# Patient Record
Sex: Male | Born: 1970 | Race: White
Health system: Western US, Academic
[De-identification: ages and names within clinical notes are randomized; demographics above are authoritative.]

---

## 2006-12-01 ENCOUNTER — Ambulatory Visit (EMERGENCY_DEPARTMENT_HOSPITAL): Payer: Self-pay

## 2007-02-16 ENCOUNTER — Encounter (HOSPITAL_BASED_OUTPATIENT_CLINIC_OR_DEPARTMENT_OTHER): Payer: Medicaid Other

## 2007-02-20 ENCOUNTER — Ambulatory Visit (HOSPITAL_BASED_OUTPATIENT_CLINIC_OR_DEPARTMENT_OTHER): Payer: Medicaid Other

## 2007-02-23 ENCOUNTER — Encounter (HOSPITAL_BASED_OUTPATIENT_CLINIC_OR_DEPARTMENT_OTHER): Payer: Medicaid Other

## 2007-04-13 ENCOUNTER — Encounter (HOSPITAL_BASED_OUTPATIENT_CLINIC_OR_DEPARTMENT_OTHER): Payer: Medicaid Other | Admitting: Internal Medicine

## 2007-08-25 ENCOUNTER — Encounter (HOSPITAL_BASED_OUTPATIENT_CLINIC_OR_DEPARTMENT_OTHER): Payer: Medicaid Other

## 2007-08-25 ENCOUNTER — Encounter (HOSPITAL_BASED_OUTPATIENT_CLINIC_OR_DEPARTMENT_OTHER): Payer: Self-pay

## 2007-08-25 ENCOUNTER — Ambulatory Visit (HOSPITAL_BASED_OUTPATIENT_CLINIC_OR_DEPARTMENT_OTHER): Payer: Medicaid Other

## 2007-08-25 VITALS — BP 117/77 | HR 134

## 2007-08-25 MED ORDER — OMEPRAZOLE 20 MG OR CPDR
DELAYED_RELEASE_CAPSULE | ORAL | Status: DC
Start: 2007-08-25 — End: 2007-12-18

## 2007-08-25 MED ORDER — VENTOLIN HFA 108 (90 BASE) MCG/ACT IN AERS
INHALATION_SPRAY | RESPIRATORY_TRACT | Status: DC
Start: 2007-08-25 — End: 2008-03-09

## 2007-08-25 MED ORDER — SYMBICORT 160-4.5 MCG/ACT IN AERO
INHALATION_SPRAY | RESPIRATORY_TRACT | Status: DC
Start: 2007-08-25 — End: 2008-03-09

## 2007-08-25 MED ORDER — HYDROMORPHONE HCL 2 MG OR TABS
ORAL_TABLET | ORAL | Status: DC
Start: 2007-08-25 — End: 2007-09-25

## 2007-08-25 MED ORDER — NORTRIPTYLINE HCL 50 MG OR CAPS
ORAL_CAPSULE | ORAL | Status: DC
Start: 2007-08-25 — End: 2007-09-27

## 2007-08-25 MED ORDER — CITALOPRAM HYDROBROMIDE 40 MG OR TABS
ORAL_TABLET | ORAL | Status: DC
Start: 2007-08-25 — End: 2008-03-09

## 2007-08-25 MED ORDER — BACLOFEN 20 MG OR TABS
ORAL_TABLET | ORAL | Status: DC
Start: 2007-08-25 — End: 2008-03-09

## 2007-08-25 MED ORDER — LISINOPRIL 20 MG OR TABS
ORAL_TABLET | ORAL | Status: DC
Start: 2007-08-25 — End: 2008-03-09

## 2007-08-25 NOTE — Progress Notes (Signed)
------------------------------------------    Attending: Donnita Falls, MD  I saw and evaluated the patient with this resident.    I discussed the case with the resident, and have reviewed the resident's note.  I agree with the findings and plan as documented in the resident's note.    Discussed options including changing to another SSRI or wellbutrin.  Patient decided to change to taking Celexa at night to minimize daytime side effects and allow possibility of increase dosing.  Recheck in 2 weeks in person or by phone.  Patient satified with this plan.  Also refer back to urology for recurrent urethral symptoms.  ----------------------------------------

## 2007-08-25 NOTE — Progress Notes (Signed)
Patrick Garrett is a 36 year old male here to discuss the following:    V04.81 VACCINE FOR INFLUENZA  V03.82 VACCINE FOR STREP PNEUMONIAE  V06.1 VACCINE FOR DTP/DTAP  Routine immunizations today.       338.29 CHRONIC PAIN NEC  Patrick Garrett is a 36 yo M to clinic to establish care. Pt notes that he had L sided lung surgery four years ago in [place], [place] at [facility]. He reports that he had previously been seen by ARNP at [facility].  He has also been on chronic pain medications for persistent L chest wall pain. He notes that was diagnosed w/ "bullous disease".  He previously has smoked 1-2packs/ day x 20years. He stopped smoking cigarettes. However, he does continue to chew tobacco. He plans to quit this as well.      He reports that he had been seen by Pulmonology previously.  He reports that about six months after his surgery he began to require 2L O2 via NC. He wears O2 most of the time. He notes that he does feel sob most of the time. This has not changed since his surgery, although he reports that for about six months he had been doing well postoperatively.  He has been on symbicort and ventolin which have helped some of his sob. His sob is most notable in am, improves w/ inhalers. He notes that he is unable to lay flat b/c of sob.  He usually sleeps elevated.  Pt notes that he sometimes walks with a cane, however, he does use his motorized wheelchair for longer distances.  He previously worked as Psychologist, occupational in well ventilated areas.     He notes he has been evaluated by pain consult through his last PCP. He notes that he has numbness from his scar site. Extended down to his LLE, which is also attributed to longstanding chronic back pain.  He notes that he has a constant/cutting pain in his L chest wall in area of surgery since the surgery. Worsens w/ deep inspiration. He additionally has chronic low back pain for which he has had cortisone injection which have provided him w/ little  relief. He has tried acupuncture in past.     No f/c/cough/. Reports just used inhalers prior to vist which explains his rapid heart rate.     PMH  -Depression/ Anxiety: Celexa is helping. Pt notes that 1 month ago went up on Celexa. Pt notes that he plans to start therapy for currently.  No si/hi/vh/ah.   -Pt notes that he has hx of R inguinal hernia repair.   -Hypertension.       Medications:  Current outpatient prescriptions : NORTRIPTYLINE HCL 50 MG OR CAPS, 1 tab daily, Disp: , Rfl: ;  BACLOFEN 20 MG OR TABS, 3 times daily, Disp: , Rfl: ;  HYDROMORPHONE HCL 2 MG OR TABS, 3 tabs daily, Disp: , Rfl: ;  LISINOPRIL 20 MG OR TABS, 1 tab daily, Disp: , Rfl: ;  VENTOLIN HFA 108 (90 BASE) MCG/ACT IN AERS, prn, Disp: , Rfl: ;  SYMBICORT 160-4.5 MCG/ACT IN AERO, 2 puffs in am and 2 puffs in pm, Disp: , Rfl:   OMEPRAZOLE 20 MG OR CPDR, 1 tab in am, Disp: , Rfl: ;  CITALOPRAM HYDROBROMIDE 40 MG OR TABS, 1 tab daily, Disp: , Rfl:     All: NKDA    SHx: He and wife are in process of separating. Pt notes that he was a Psychologist, occupational previously.  Currently he spends his day watches TV.  He does some chores around the house.  Pt notes that he is limited by SOB, pain.     REVIEW OF SYSTEMS:  As above.     OBJECTIVE:  BP 117/77  Pulse 134. 2L O2 NC 98%  General: Thin Caucasian M, in wheelchair. O2 in place via NC  CV: Tachycardic, regular rhythm. No m  Pulm: L sided thoractomy scar. Pt notes numbness over scar site on palpation.  No crackles, wheeze. Pt is not tachypneic.   MSKE: Upper ext, DIP B w/ clubbing of finger nails.     ASSESSMENT AND PLAN: 36 yo M w/ chronic pain, lung disease (dx unclear at this point, will await records, conerning for possible copd given hx of "bullous" disease per pt).    V04.81 VACCINE FOR INFLUENZA  V03.82 VACCINE FOR STREP PNEUMONIAE  V06.1 VACCINE FOR DTP/DTAP  -Given respiratory status, advised Pt that he should have flu shot annually, pneumovax today.  -Tdap today as well.     338.29 CHRONIC PAIN  NEC  Plan: STANDARD DRUG SCREEN, URN  -Continue current regimen.   -Utox today to establish Pt is taking medicatinos as listed.   -Established pain contract w/ patient. Discussed w/ Pt in detail terms of contract, additionally advised Pt that goals of pain management will be to increase his function, will not be able to completely take away his pain.    -Consider methadone for long term pain control. Advised Pt that I will need to see his records prior to refills  -Will also need records from surgery as well as pulmonary notes.   -Stable O2 sat today.     RTC as soon as records made available to me.   Audie Box, MD

## 2007-08-25 NOTE — Progress Notes (Signed)
-----------------------------------------------    FOB BASIC SERVICES  Patient checked in:  YES  Vitals:  YES  Medications reviewed:  YES     FU visit and/or single test scheduled:  NO  Room Use in Excess of 25 Minutes:  no  Room Use Additional 25 Minutes : no  Chaperoning (other than procedures): no    Total number of YES responses: 3  Completed by: Haeley Fordham    ------------------------------------------  Pre-immunization screening questions were asked and answered.  VIS given to patient.  Patient has no allergies to vaccine or components.  Patient or their caregiver gave verbal consent for immunization.      I administered the injection.  Mehdi Gironda, Medical Assistant

## 2007-08-26 ENCOUNTER — Telehealth (HOSPITAL_BASED_OUTPATIENT_CLINIC_OR_DEPARTMENT_OTHER): Payer: Self-pay

## 2007-08-26 ENCOUNTER — Encounter (HOSPITAL_BASED_OUTPATIENT_CLINIC_OR_DEPARTMENT_OTHER): Payer: Self-pay

## 2007-08-26 LAB — STANDARD DRUG SCREEN, URN
Alcohol (Ethyl), URN: NEGATIVE mg/dL
Amphet/Methamphetamine Qual,URN: NEGATIVE
Barbiturate (Qual), URN: NEGATIVE
Benzodiazepines (Qual), URN: NEGATIVE
Cannabinoids (Qual), URN: NEGATIVE
Cocaine (Qual), URN: NEGATIVE
Drug Screen Test Info, URN: POSITIVE
Drug Screen Test Info, URN: POSITIVE
Methadone (Qual), URN: POSITIVE
Opiates (Qual), URN: POSITIVE
Phencyclidine (Qual), URN: NEGATIVE
Tricyclic Antidepressants, URN: POSITIVE

## 2007-08-26 NOTE — Telephone Encounter (Signed)
Called Mr. Cordaro back. Created letter, will have faxed to [employer]. Mr. Rainville did not mention at our visit that he is on Methadone in addition to the hydromorphone. He states that he would like to be taken off methadone. However, emphasized methadone is typically used for long acting pain control. WIll not make changes until I see records from prior clinc. Additionally, advised Mr. Salek that I would like him to establish w/ a counselor prior to considering tx w/ Chantix given black box warning. I think that he may need adjustment on medication for depression. It is possible to try zyban, however, may prolong effects of TCA.  Advised Mr. Langenbach that I need to see his records, particularly the pain management records prior to making changes to medications.     Advised to continue on Methadone (he has enough of this medication at home).  Will see him on f/u after records are faxed.     Takelia Urieta,MD

## 2007-08-26 NOTE — Telephone Encounter (Signed)
Pt called asking about medication Chantix you discussed yesterday, will you prescribe this medication? Pls adv pt-

## 2007-08-26 NOTE — Telephone Encounter (Signed)
Patient called, needs a letter from you indicating that he needs transportation on [employer] [place] and used o2. Please call patient if you need more information. Please fax to [employer] [phone] attention: Karen Kitchens

## 2007-09-05 NOTE — Progress Notes (Addendum)
Addended by: Donnal Moat on: 09/05/2007 1:46:26 PM     Modules accepted: Orders

## 2007-09-05 NOTE — Progress Notes (Addendum)
follow up appt made fob level added

## 2007-09-21 ENCOUNTER — Telehealth (HOSPITAL_BASED_OUTPATIENT_CLINIC_OR_DEPARTMENT_OTHER): Payer: Self-pay

## 2007-09-21 NOTE — Telephone Encounter (Signed)
Called pt to review documentation for [employer]. Pt notes that he cannot access public transportation due to o2 requirement.  However, will need to assess if pt needs attendant which is what [employer] requires. I need to verify this w/ patient. Advised to call tomorrow as I am in clinic.  Coye Dawood,MD

## 2007-09-25 ENCOUNTER — Ambulatory Visit (HOSPITAL_BASED_OUTPATIENT_CLINIC_OR_DEPARTMENT_OTHER): Payer: Medicaid Other

## 2007-09-25 ENCOUNTER — Telehealth (HOSPITAL_BASED_OUTPATIENT_CLINIC_OR_DEPARTMENT_OTHER): Payer: Self-pay

## 2007-09-25 VITALS — BP 129/85 | HR 102 | Temp 97.7°F | Wt 209.0 lb

## 2007-09-25 MED ORDER — HYDROMORPHONE HCL 2 MG OR TABS
ORAL_TABLET | ORAL | Status: DC
Start: 2007-09-25 — End: 2007-10-23

## 2007-09-25 MED ORDER — PREDNISONE 20 MG OR TABS
ORAL_TABLET | ORAL | Status: DC
Start: 2007-09-25 — End: 2007-11-13

## 2007-09-25 MED ORDER — LEVOFLOXACIN 500 MG OR TABS
ORAL_TABLET | ORAL | Status: DC
Start: 2007-09-25 — End: 2007-11-13

## 2007-09-25 NOTE — Telephone Encounter (Signed)
Patient saw you today and forgot to talk with you about his medication for anxiety. I can't remember the name, but has been on it in the past. Please call patient.

## 2007-09-25 NOTE — Progress Notes (Signed)
------------------------------------------    Attending: Doristine Section, MD  I saw and evaluated the patient with this resident.  I repeated the essential components of the history and exam.  I discussed the case with the resident, and have reviewed the resident's note.  I agree with the findings and plan as documented in the resident's note.  Key findings based upon my evaluation and discussion:  Acute Bronchitis.  ----------------------------------------

## 2007-09-25 NOTE — Progress Notes (Signed)
Patrick Garrett is a 37 year old male here to discuss the following:    491.20 OBSTR CHR BRONCHITIS W/O AC EXACERB  (primary encounter diagnosis)  Awaiting records. Hx of bullous disease.  Pt notes that he has coughed up some yellow green sputum. Note that may have had a temperature.  No blood in sputum.  Subjective fevers. Sweats.  Fatigue.  Some ha.  Notes this have been going for 2-3 days.  Notes that he feels his sob has worsened.  Using inhalers. Has responded to steroids before. Can walk hallway at baseline, has not been able to go as far pat 2-3 days.     338.4 CHRONIC PAIN SYNDROME  -[employer]. Pt notes that caregiver (previously girlfriend) will be caregiver.  She woud be his attendant.   -Methadone 90mg . [facility]. Goes in two times a week.  Give him weekly allocation of methadone.  Currently on hydromorphone for breakthrough. Would like decrease TCA to 25mg  in evening as too sedating.       REVIEW OF SYSTEMS:  As above.     OBJECTIVE:  BP 129/85  Pulse 102  Wt 209 lb (94.802 kg) 98% on 2 L  General: [name]. NAD. A/Ox3.  Pt speaks in short sentences which is his baseline. Walking w/ cane today.   Pulm: No crackles. End exp wheeze isolated to LLL.     ASSESSMENT AND PLAN:    491.20 OBSTR CHR BRONCHITIS W/O AC EXACERB  -Change in sputum, cough. Concern this may be beginning of exacerbation. Afebrile today. Does not sound clinically like a pneumonia. Subjective sensation that sob has worsened.   -Pred burst. Levoquin for coverage for exacerbation as will manage as outpatient. Pt aware of signs to seek care over the weekend.   -Continue symbicort, ventolin.     338.4 CHRONIC PAIN SYNDROME  -Hydromorphone refilled, [facility].   -Pt has request records be sent to clinic.   -Placed initial referral for pain consult.     RTC in 1 month or prn.     Audie Box, MD

## 2007-09-25 NOTE — Progress Notes (Signed)
-----------------------------------------------    FOB BASIC SERVICES  Patient checked in:  YES  Vitals:  YES  Medications reviewed:  YES     FU visit and/or single test scheduled:  NO  Room Use in Excess of 25 Minutes:  no  Room Use Additional 25 Minutes : no  Chaperoning (other than procedures): no    Total number of YES responses: 3  Completed by: Johnston Maddocks    ------------------------------------------

## 2007-09-26 NOTE — Progress Notes (Signed)
follow up appt made fob level added

## 2007-09-28 NOTE — Telephone Encounter (Signed)
Kriste Basque can you call pt? Can you ask if he needs a refill?      Ali Mclaurin,MD

## 2007-09-28 NOTE — Telephone Encounter (Signed)
F/u w/ Pt.  [phone]  Breathing has improved. Pt notes he has been on benzo in past for anxiety. Notes he just needs something to deal w/ anxiety during the day.  Currently going through transition w/ girl friend. Wife has filed for divorce.   Advised that need records, Pt reports he is in counseling. Would like to schedule appt to meet w/ patient to discuss his anxiety.      Patrick Hinks,MD

## 2007-09-28 NOTE — Telephone Encounter (Signed)
Patient called back, this is not a refill, it's a new medication. He has taken, Clonepine. He's really needing something soon. Please call patient.

## 2007-09-29 ENCOUNTER — Encounter (HOSPITAL_BASED_OUTPATIENT_CLINIC_OR_DEPARTMENT_OTHER): Payer: Medicaid Other

## 2007-10-06 ENCOUNTER — Telehealth (HOSPITAL_BASED_OUTPATIENT_CLINIC_OR_DEPARTMENT_OTHER): Payer: Self-pay

## 2007-10-06 NOTE — Telephone Encounter (Signed)
Spoke w Mr. Noyola regarding his pain medication. He is receiving methadone from tx facility. Per clinic policy, cannot provide w / additional pain medication.  Pt states he has asked about records. However, still have not received.     Fill out wheelchair, bus forms. In pile to be faxed near purple [employer].     Philbert Riser Aries Townley,MD

## 2007-10-06 NOTE — Telephone Encounter (Signed)
Has paperwork for [facility] [place] been rec'd? If rec'd what is status?

## 2007-10-14 ENCOUNTER — Encounter (HOSPITAL_BASED_OUTPATIENT_CLINIC_OR_DEPARTMENT_OTHER): Payer: Self-pay

## 2007-10-14 NOTE — Progress Notes (Addendum)
Placed records in Dr [employer] mailbox for upcoming appt.

## 2007-10-14 NOTE — Progress Notes (Signed)
Records received 10-14-07. Gave them to lisa as requested.

## 2007-10-21 ENCOUNTER — Encounter (HOSPITAL_BASED_OUTPATIENT_CLINIC_OR_DEPARTMENT_OTHER): Payer: Self-pay

## 2007-10-21 ENCOUNTER — Telehealth (HOSPITAL_BASED_OUTPATIENT_CLINIC_OR_DEPARTMENT_OTHER): Payer: Self-pay

## 2007-10-21 NOTE — Telephone Encounter (Signed)
Received records from [facility]. Attempted to contact Pt at numbers provided as well as from past telephone encounters.  Records indicate complex pain hx receiving tx through numerous pain clinics. However no records provided from pain clinic. Unable to ascertain from records why pt is on methadone as noted in utox.  Pt knows that I cannot prescribe pain meds without seeing records or when receiving methadone at another facility. We discussed this during a prior encounter.     Blayke Pinera,MD

## 2007-10-23 ENCOUNTER — Ambulatory Visit (HOSPITAL_BASED_OUTPATIENT_CLINIC_OR_DEPARTMENT_OTHER): Payer: Medicaid Other

## 2007-10-23 VITALS — BP 128/80 | HR 95

## 2007-10-23 MED ORDER — BOOST PLUS OR LIQD
ORAL | Status: DC
Start: 2007-10-23 — End: 2008-01-27

## 2007-10-23 MED ORDER — METHADONE HCL 10 MG OR TABS
ORAL_TABLET | ORAL | Status: DC
Start: 2007-10-23 — End: 2007-11-13

## 2007-10-23 MED ORDER — HYDROMORPHONE HCL 2 MG OR TABS
ORAL_TABLET | ORAL | Status: DC
Start: 2007-10-23 — End: 2007-11-13

## 2007-10-23 NOTE — Progress Notes (Signed)
-----------------------------------------------    FOB BASIC SERVICES  Patient checked in:  YES  Vitals:  YES  Medications reviewed:  YES     FU visit and/or single test scheduled:  NO  Room Use in Excess of 25 Minutes:  no  Room Use Additional 25 Minutes : no  Chaperoning (other than procedures): no    Total number of YES responses: 3  Completed by: Stein Windhorst    ------------------------------------------

## 2007-10-23 NOTE — Progress Notes (Signed)
.-------------------------------------------    Attending: Tillie Rung, MD  I discussed this patient's history and physical findings with the resident, as well as the plan for this patient.  I have reviewed and confirm the findings and plan as documented in the resident's note .  Key findings based upon this discussion:  Pt with COPD s/p thoracotomy and with chronic severe back pain.  Pt has been treating pain with methadone 90 mg/day and dilaudid for "breakthrough" pain which he uses regularly TID.  Was receiving this medication in [place] in weekly supplies.  Pt feels pain is uncontrolled.  Wheelchair bound.  Pain consultation early next week.  Would anticipate tapering off dilaudid.  Would likely need multidisciplinary care.

## 2007-10-26 NOTE — Progress Notes (Signed)
Patrick Garrett is a 37 year old male here to discuss the following:    338.4 CHRONIC PAIN SYNDROME  (primary encounter diagnosis)  37 yo M to clinic for f/u of pain. I contacted Arlys John at [facility]. Noted that they were aware Patrick Garrett received methadone from their clinic and dilaudid from providers at [facility]. Patrick Garrett reports that he had lived in [place] when he moved out here, he found a clinic to provide methadone for his pain (verified no hx of methadone for issues other than pain). He then found a PCP several months later.      I called Arlys John at [facility], informed him that Patrick Garrett will no longer receive methadone from their clinic.   Patrick Garrett notes that he will take his remaining methadone to return to [facility], has two bottles of liquid.  He turned in one bottle to me.  He notes that he continues to have numbness/sharp pains along his L chest wall since this thoracotomy.      REVIEW OF SYSTEMS:  No n/v. Some decreased appetitie.     OBJECTIVE:  BP 128/80  Pulse  95  General: [name]. NAD. A/Ox3  Chest wall: L throactomy scar present, continues to have ttp along chest wall.     ASSESSMENT AND PLAN:    338.4 CHRONIC PAIN SYNDROME  (primary encounter diagnosis)  -Pt has appt w/ chronic pain consult on March 4. Given he is taking both methadone and dilaudid (for breakthrough pain but taking scheduled) I will continue him on these medications for now. I would like him to be tapered off diluadid. Patrick Garrett would like to be on one medication, however, he reports that he does not believe his methadone is working. I informed Patrick Garrett that he is on 90mg  of Methdone, increasing the dose may not be helpful.   -Continue TCA, SSRI.   -Will await recommendations form pain consult.     RTC in 1 month    Audie Box, MD

## 2007-11-04 ENCOUNTER — Encounter (HOSPITAL_BASED_OUTPATIENT_CLINIC_OR_DEPARTMENT_OTHER): Payer: Medicaid Other

## 2007-11-04 NOTE — Progress Notes (Addendum)
Addended by: Donnal Moat on: 11/04/2007 9:12:28 AM     Modules accepted: Orders

## 2007-11-13 ENCOUNTER — Telehealth (HOSPITAL_BASED_OUTPATIENT_CLINIC_OR_DEPARTMENT_OTHER): Payer: Self-pay

## 2007-11-13 ENCOUNTER — Ambulatory Visit (HOSPITAL_BASED_OUTPATIENT_CLINIC_OR_DEPARTMENT_OTHER): Payer: Medicaid Other

## 2007-11-13 VITALS — BP 129/92 | HR 100

## 2007-11-13 MED ORDER — METHADONE HCL 10 MG OR TABS
ORAL_TABLET | ORAL | Status: DC
Start: 2007-11-13 — End: 2007-12-18

## 2007-11-13 MED ORDER — CELEXA 20 MG OR TABS
ORAL_TABLET | ORAL | Status: DC
Start: 2007-11-13 — End: 2008-03-09

## 2007-11-13 MED ORDER — HYDROMORPHONE HCL 2 MG OR TABS
ORAL_TABLET | ORAL | Status: DC
Start: 2007-11-13 — End: 2007-12-18

## 2007-11-13 NOTE — Telephone Encounter (Signed)
Pharmacy faxed patient refill.  Please advise.  Thanks.

## 2007-11-13 NOTE — Progress Notes (Signed)
-------------------------------------------    Attending: Earlie Raveling, MD  I discussed this patient's history and physical findings with the resident, as well as the plan for this patient.  I have reviewed and confirm the findings and plan as documented in the resident's note .  Key findings based upon this discussion:  Depression Rx.  -------------------------------------------

## 2007-11-13 NOTE — Telephone Encounter (Signed)
Patient is calling to let Dr. Chesley Noon know what she needs to write in her letter of reference for public housing.  (This was discussed in today's appointment)  The letter needs to be on [facility] letter head.    And include:    Full name  Address  Telephone  Character re: to housing  What capacity you know him  Does she believe him to be responsible  Does she feel that he'll be a good tenant for public housing    Mail to him ASAP.  He has 5 days to get the letter in.    [address]  [place], [place] [address]

## 2007-11-13 NOTE — Telephone Encounter (Addendum)
Printed letter. Placed in file to be mailed.     Patrick Weygandt,MD

## 2007-11-13 NOTE — Progress Notes (Signed)
-----------------------------------------------    FOB BASIC SERVICES  Patient checked in:  YES  Vitals:  YES  Medications reviewed:  YES     FU visit and/or single test scheduled:  NO  Room Use in Excess of 25 Minutes:  no  Room Use Additional 25 Minutes : no  Chaperoning (other than procedures): no    Total number of YES responses: 3  Completed by: Deveron Furlong    ------------------------------------------  Dr Chesley Noon asked that I get rid of 2 bottles of pt's methadone.  I removed labels, dumped liquid down drain, then put bottles in sharps container with Blanca Friend as witness.

## 2007-11-13 NOTE — Progress Notes (Signed)
Patrick Garrett is a 37 year old male here to discuss the following:    338.4 CHRONIC PAIN SYNDROME  Pt to clinic to f/u on on pain management. Brought in remaining bottles of methadone from prior pain clinic, unopened. Notes that pain has been better now that he is on scheduled doses of his methadone. He notes that he has been able to do more household chores at home. Still limited by respiratory issues.  Continued pain at thoracotomy site, back. No side effects from medication. Taking as prescribed. Reports that he did put a heating pad on his lower back, felt he might have burned this area. As put directly on skin.     311 DEPRESSIVE DISORDER NEC  Had been on Celexa 40mg , reports for six month this dose was helpful. Continues to note that with divorce from wife he has noted worsening depression.  PHQ 9 = 18 today. Most vegetative sx. Difficulty motivating to do things.  Notes that he is still seeing his counselor. No si.        REVIEW OF SYSTEMS:  As above.     OBJECTIVE:  BP 129/92  Pulse 100  General: [name]. NAD. A/Ox3.   Skin: Lower back, some areas of pink skin, healing. No drainage. No ttp.  Superficial.       ASSESSMENT AND PLAN:    338.4 CHRONIC PAIN SYNDROME  -Advised on proper way to use heating pad. Skin healing, no evidence of infection, lower back.   -Continue methadone, hydromorphone. Pt has initial appt w/ pain consult at end of month. Ideally would like to maximize medication so pt is on one agent. Would like to taper off hydromorphone but Pt is on max dose of methadone. Also including TCA, SSRI in regimen.     311 DEPRESSIVE DISORDER NEC  -Depression most likely exacerbated by recent change in relationship  -Increase Celexa to 60mg  (40 +20) i po QD.   -Countinue counseling.   -Advised on side effects.     RTC in one month.     Audie Box, MD

## 2007-11-13 NOTE — Telephone Encounter (Signed)
Have not talked w/ pt about this yet.   Will d/w pt at f/u. Tx for depression, recently increased dose. Do not want to start Chantix until depression  More stable.   \  Myrella Fahs,MD

## 2007-11-14 NOTE — Telephone Encounter (Signed)
done

## 2007-11-17 NOTE — Progress Notes (Addendum)
follow up appointment made fob level added

## 2007-11-17 NOTE — Progress Notes (Addendum)
Addended by: Daleen Snook B on: 11/17/2007 1:50:51 PM     Modules accepted: Orders

## 2007-12-01 ENCOUNTER — Encounter (HOSPITAL_BASED_OUTPATIENT_CLINIC_OR_DEPARTMENT_OTHER): Payer: Medicaid Other

## 2007-12-01 DIAGNOSIS — IMO0001 Reserved for inherently not codable concepts without codable children: Secondary | ICD-10-CM

## 2007-12-01 DIAGNOSIS — M545 Low back pain, unspecified: Secondary | ICD-10-CM

## 2007-12-06 ENCOUNTER — Other Ambulatory Visit (HOSPITAL_BASED_OUTPATIENT_CLINIC_OR_DEPARTMENT_OTHER): Payer: Medicaid Other

## 2007-12-09 ENCOUNTER — Other Ambulatory Visit (HOSPITAL_BASED_OUTPATIENT_CLINIC_OR_DEPARTMENT_OTHER): Payer: Medicaid Other

## 2007-12-10 ENCOUNTER — Encounter (HOSPITAL_BASED_OUTPATIENT_CLINIC_OR_DEPARTMENT_OTHER): Payer: Medicaid Other

## 2007-12-11 ENCOUNTER — Telehealth (HOSPITAL_BASED_OUTPATIENT_CLINIC_OR_DEPARTMENT_OTHER): Payer: Self-pay

## 2007-12-11 NOTE — Telephone Encounter (Signed)
Dr Luan Pulling signed last visits note of 11/13/07, then faxed to number below.

## 2007-12-11 NOTE — Telephone Encounter (Signed)
Pt called with representatives from [facility] in [place] pt is checking in there and the facility must have a medication list with instructions faxed over to [phone] before they will sign in pt- the list MUST be signed by a physician (any)-pls send list

## 2007-12-11 NOTE — Telephone Encounter (Addendum)
Fax came back, so I called the pt (the only tele number on the encounter).  Pt said to disregard message, because his case worker found temporary housing for him today.

## 2007-12-14 ENCOUNTER — Encounter (HOSPITAL_BASED_OUTPATIENT_CLINIC_OR_DEPARTMENT_OTHER): Payer: Medicaid Other

## 2007-12-16 ENCOUNTER — Ambulatory Visit (HOSPITAL_BASED_OUTPATIENT_CLINIC_OR_DEPARTMENT_OTHER): Payer: Medicaid Other | Admitting: Pain Medicine

## 2007-12-18 ENCOUNTER — Ambulatory Visit (HOSPITAL_BASED_OUTPATIENT_CLINIC_OR_DEPARTMENT_OTHER): Payer: Medicaid Other

## 2007-12-18 ENCOUNTER — Encounter (HOSPITAL_BASED_OUTPATIENT_CLINIC_OR_DEPARTMENT_OTHER): Payer: Medicaid Other

## 2007-12-18 VITALS — BP 114/81 | HR 85

## 2007-12-18 MED ORDER — HYDROMORPHONE HCL 2 MG OR TABS
ORAL_TABLET | ORAL | Status: DC
Start: 2007-12-18 — End: 2008-01-12

## 2007-12-18 MED ORDER — PANTOPRAZOLE SODIUM 40 MG OR TBEC
DELAYED_RELEASE_TABLET | ORAL | Status: DC
Start: 2007-12-18 — End: 2008-02-11

## 2007-12-18 MED ORDER — METHADONE HCL 10 MG OR TABS
ORAL_TABLET | ORAL | Status: DC
Start: 2007-12-18 — End: 2008-01-12

## 2007-12-18 MED ORDER — METHADONE HCL 10 MG OR TABS
ORAL_TABLET | ORAL | Status: DC
Start: 2007-12-18 — End: 2007-12-18

## 2007-12-18 MED ORDER — HYDROMORPHONE HCL 2 MG OR TABS
ORAL_TABLET | ORAL | Status: DC
Start: 2007-12-18 — End: 2007-12-18

## 2007-12-18 NOTE — Progress Notes (Signed)
-------------------------------------------    Attending: Doristine Section, MD  I discussed this patient's history and physical findings with the resident, as well as the plan for this patient.  I have reviewed and confirm the findings and plan as documented in the resident's note .  Key findings based upon this discussion:  Chronic Pain refill meds follow up after pain consult.  -------------------------------------------

## 2007-12-18 NOTE — Progress Notes (Signed)
-----------------------------------------------    FOB BASIC SERVICES  Patient checked in:  YES  Vitals:  YES  Medications reviewed:  YES     FU visit and/or single test scheduled:  NO  Room Use in Excess of 25 Minutes:  no  Room Use Additional 25 Minutes : no  Chaperoning (other than procedures): no    Total number of YES responses: 3  Completed by: Analyse Angst    ------------------------------------------

## 2007-12-18 NOTE — Progress Notes (Signed)
Patrick Garrett is a 37 year old male here to discuss the following:    338.4 CHRONIC PAIN SYNDROME  Appreciate recommendations of pain consultants. Recommended addition of topamax. Pt continues to feel as though methadone is causing increased sweating. Pt reports previously had been on oxycontin. Currently on SSRI, methadone, dilaudid. Has stopped nortriptylline. Continues on baclofen.  Pt has received Bilateral ileocostalis trigger point injections, which provided slight temporary relief. Has f/u appt  For caudal block procedure. Additionally consider discogram if epidural block does not improve symptoms    787.1 HEARTBURN  Pt notes that has occasional episodes of harsh taste in mouth, heartburn sx especially in am despite being on omeprazole. Notes on baclofen. No additional etoh at this time. No emesis. No nausea. No abdominal pain. Pt notes several weeks ago small amt of coffee ground appearance in one episode of emesis in setting of heartburn.     REVIEW OF SYSTEMS:  As above.     OBJECTIVE:  BP 114/81  Pulse 85  General: [name]. NAD. Shaved head for sweating.       ASSESSMENT AND PLAN:    338.4 CHRONIC PAIN SYNDROME  -Continue Methadone, Dilaudid  -Will await results of caudal epidural block. Agree sx seem more due to neuropathic pain. Encouraged topamax which patient declined. He is interested in being on fewer medications. However would rather stop methadone. Advised that at this time he needs to be on a long acting pain medication  -If sx not improved will d/w Pt discogram recommendations  -WIll continue to focus patient on purposed of treatment to encourage increased function.   -If sx responding to caudal injections, consider tapering dilaudid.     787.1 HEARTBURN  -Breakthrough reflux on omeprazole, Pt notes that he has not increased the dose but has had reflux on omeprazole.  -Baclofen would be a risk for PUD  -Will check stool h.pylori advised to hold baclofen if can tolerate, hold PPI for  several days than send in stool sample.  Will then plan to start protonix for tx of GERD sx.   -If sx persisting send for EGD.     RTC in one month    Audie Box, MD

## 2007-12-21 ENCOUNTER — Encounter (HOSPITAL_BASED_OUTPATIENT_CLINIC_OR_DEPARTMENT_OTHER): Payer: Medicaid Other | Admitting: Rehabilitative and Restorative Service Providers"

## 2007-12-21 NOTE — Progress Notes (Addendum)
follow up appointment made fob level added

## 2007-12-21 NOTE — Progress Notes (Addendum)
Addended by: LUCHEMBE, KASONDE B on: 12/21/2007      Modules accepted: Orders

## 2007-12-30 ENCOUNTER — Encounter (HOSPITAL_BASED_OUTPATIENT_CLINIC_OR_DEPARTMENT_OTHER): Payer: Medicaid Other

## 2008-01-12 ENCOUNTER — Telehealth (HOSPITAL_BASED_OUTPATIENT_CLINIC_OR_DEPARTMENT_OTHER): Payer: Self-pay

## 2008-01-12 ENCOUNTER — Encounter (HOSPITAL_BASED_OUTPATIENT_CLINIC_OR_DEPARTMENT_OTHER): Payer: MEDICAID

## 2008-01-12 MED ORDER — HYDROMORPHONE HCL 2 MG OR TABS
ORAL_TABLET | ORAL | Status: DC
Start: 2008-01-12 — End: 2008-02-11

## 2008-01-12 MED ORDER — METHADONE HCL 10 MG OR TABS
ORAL_TABLET | ORAL | Status: DC
Start: 2008-01-12 — End: 2008-02-11

## 2008-01-12 NOTE — Telephone Encounter (Addendum)
Pt unable to be seen in clinic due to insurance issues Plans to have this resolved by the first of the month. Has f/u appt w/ pain. Interested in weaning of dilaudid today. Will fill scripts. Pt to f/u in one month for further med management. Pt counseled on side effects.  Will go down by one mg per every few weeks.     Patrick Colligan,MD

## 2008-01-19 ENCOUNTER — Encounter (HOSPITAL_BASED_OUTPATIENT_CLINIC_OR_DEPARTMENT_OTHER): Payer: Medicaid Other

## 2008-01-27 ENCOUNTER — Telehealth (HOSPITAL_BASED_OUTPATIENT_CLINIC_OR_DEPARTMENT_OTHER): Payer: Self-pay

## 2008-01-27 MED ORDER — BOOST PLUS OR LIQD
ORAL | Status: DC
Start: 2008-01-27 — End: 2008-03-09

## 2008-01-27 NOTE — Telephone Encounter (Signed)
[facility] left message on my phone asking for an urgent PA and refill for ensure, because pt out at the end of the month.  Please advise.  Thanks.    Luther Redo   [phone] fax  [phone] ext [phone] tele

## 2008-01-28 NOTE — Telephone Encounter (Addendum)
After talking with [facility], they clarified that pt will only be on [facility] until the end of the month.  After that, he will be on a different insurance.  To get the prior auth, chart notes stating medical necessity, need to be sent to the fax number below.  PA will only be good until the end of the month.  Please advise.  Thanks.

## 2008-01-28 NOTE — Telephone Encounter (Signed)
What is the question I have to answer?    Patrick Lagrange,MD

## 2008-01-29 NOTE — Telephone Encounter (Signed)
I need the chart notes stating medical necessity for Insure to fax to [facility] for the PA.  Please advise.  Thanks.

## 2008-02-01 NOTE — Telephone Encounter (Signed)
Beginning of month. Will address w/ pt at visit as insurance has changed.     Derward Marple,MD

## 2008-02-04 ENCOUNTER — Encounter (HOSPITAL_BASED_OUTPATIENT_CLINIC_OR_DEPARTMENT_OTHER): Payer: Medicaid Other | Admitting: Surgical

## 2008-02-05 ENCOUNTER — Encounter (HOSPITAL_BASED_OUTPATIENT_CLINIC_OR_DEPARTMENT_OTHER): Payer: Medicaid Other

## 2008-02-05 ENCOUNTER — Ambulatory Visit (HOSPITAL_BASED_OUTPATIENT_CLINIC_OR_DEPARTMENT_OTHER): Payer: Self-pay

## 2008-02-08 ENCOUNTER — Telehealth (HOSPITAL_BASED_OUTPATIENT_CLINIC_OR_DEPARTMENT_OTHER): Payer: Self-pay

## 2008-02-08 ENCOUNTER — Ambulatory Visit (HOSPITAL_BASED_OUTPATIENT_CLINIC_OR_DEPARTMENT_OTHER): Payer: Medicaid Other

## 2008-02-08 NOTE — Telephone Encounter (Signed)
Pt scheduled for Thursday-closed encounter

## 2008-02-08 NOTE — Telephone Encounter (Signed)
Pt called to say he will be out of meds on 02/10/08 and [facility] didn't pick him up on Friday-pt can't get an appt w/you until 02/22/08-can this pt get refills by another md-pls adv

## 2008-02-08 NOTE — Telephone Encounter (Signed)
Can you double book him into my schedule on Thursday?

## 2008-02-09 ENCOUNTER — Encounter (HOSPITAL_BASED_OUTPATIENT_CLINIC_OR_DEPARTMENT_OTHER): Payer: Medicaid Other | Admitting: Family Medicine

## 2008-02-09 ENCOUNTER — Telehealth (HOSPITAL_BASED_OUTPATIENT_CLINIC_OR_DEPARTMENT_OTHER): Payer: Self-pay

## 2008-02-09 NOTE — Telephone Encounter (Signed)
Pt at front desk stating he is out of pain meds, would like Dilaudid and Methadone today. Per Dr. Dola Factor no early refills will be given and Pt needs to keep his appt scheduled with Dr. Chesley Noon on 02/11/08 for refills.

## 2008-02-11 ENCOUNTER — Encounter (HOSPITAL_BASED_OUTPATIENT_CLINIC_OR_DEPARTMENT_OTHER): Payer: Self-pay

## 2008-02-11 ENCOUNTER — Ambulatory Visit (HOSPITAL_BASED_OUTPATIENT_CLINIC_OR_DEPARTMENT_OTHER): Payer: MEDICAID

## 2008-02-11 VITALS — BP 136/97 | HR 74

## 2008-02-11 MED ORDER — METHADONE HCL 10 MG OR TABS
ORAL_TABLET | ORAL | Status: DC
Start: 2008-02-11 — End: 2008-03-09

## 2008-02-11 MED ORDER — HYDROMORPHONE HCL 2 MG OR TABS
ORAL_TABLET | ORAL | Status: DC
Start: 2008-02-11 — End: 2008-03-09

## 2008-02-11 MED ORDER — PANTOPRAZOLE SODIUM 40 MG OR TBEC
DELAYED_RELEASE_TABLET | ORAL | Status: DC
Start: 2008-02-11 — End: 2008-03-09

## 2008-02-11 NOTE — Progress Notes (Signed)
-----------------------------------------------    FOB BASIC SERVICES  Patient checked in:  YES  Vitals:  YES  Medications reviewed:  YES     FU visit and/or single test scheduled:  NO  Room Use in Excess of 25 Minutes:  no  Room Use Additional 25 Minutes : no  Chaperoning (other than procedures): no    Total number of YES responses: 3  Completed by: Chau Savell    ------------------------------------------

## 2008-02-11 NOTE — Progress Notes (Signed)
KATLIN CISZEWSKI is a 37 year old male here to discuss the following:    530.81 ESOPHAGEAL REFLUX  (primary encounter diagnosis)  Needs pantoprazole refill. Omeprazole no longer working. Was unable to fill last pantoprazole script.       338.4 CHRONIC PAIN SYNDROME  Unable to make appt for last injection. Tolerated decrease in dilaudid dose. No chills, changes in bm, n/v. Continues to have chest wall and low back pain. Still working on increase work capacity at home in terms of chores.     REVIEW OF SYSTEMS:  As above.     OBJECTIVE:  BP 136/97  Pulse 74  General: {[name]. NAD. A/Ox3  chest wall: Scar from thoractomy, some ttp along scar.     ASSESSMENT AND PLAN:    530.81 ESOPHAGEAL REFLUX  (primary encounter diagnosis)  Refilled pantoprazole.     338.4 CHRONIC PAIN SYNDROME  Continue diluadid taper. Will be off by this month.   Continue current dose of methadone.   Continue to discuss adjunct therapy  Will then plan on decrease methadone dose as is currently fairly high, also could consider switching to alternative long acting agent.     RTC one month    Audie Box, MD

## 2008-02-11 NOTE — Progress Notes (Signed)
-------------------------------------------    Attending: Alvy Beal, MD  I discussed this patient's history and physical findings with the resident, as well as the plan for this patient.  I have reviewed and confirm the findings and plan as documented in the resident's note .  Key findings based upon this discussion: agree with pain med management.    -------------------------------------------

## 2008-02-13 NOTE — Progress Notes (Addendum)
follow up appt made fob level added

## 2008-02-13 NOTE — Progress Notes (Addendum)
Addended by: FONTANOS, LISA on: 02/13/2008      Modules accepted: Orders

## 2008-02-22 ENCOUNTER — Encounter (HOSPITAL_BASED_OUTPATIENT_CLINIC_OR_DEPARTMENT_OTHER): Payer: Medicaid Other

## 2008-02-22 ENCOUNTER — Encounter (HOSPITAL_BASED_OUTPATIENT_CLINIC_OR_DEPARTMENT_OTHER): Payer: Self-pay

## 2008-03-01 ENCOUNTER — Encounter (HOSPITAL_BASED_OUTPATIENT_CLINIC_OR_DEPARTMENT_OTHER): Payer: Self-pay

## 2008-03-03 ENCOUNTER — Encounter (HOSPITAL_BASED_OUTPATIENT_CLINIC_OR_DEPARTMENT_OTHER): Payer: MEDICAID

## 2008-03-09 ENCOUNTER — Telehealth (HOSPITAL_BASED_OUTPATIENT_CLINIC_OR_DEPARTMENT_OTHER): Payer: Self-pay

## 2008-03-09 ENCOUNTER — Ambulatory Visit (HOSPITAL_BASED_OUTPATIENT_CLINIC_OR_DEPARTMENT_OTHER): Payer: Medicaid Other

## 2008-03-09 VITALS — BP 139/92 | HR 90

## 2008-03-09 MED ORDER — SYMBICORT 160-4.5 MCG/ACT IN AERO
INHALATION_SPRAY | RESPIRATORY_TRACT | Status: DC
Start: 2008-03-09 — End: 2008-08-11

## 2008-03-09 MED ORDER — PANTOPRAZOLE SODIUM 40 MG OR TBEC
DELAYED_RELEASE_TABLET | ORAL | Status: DC
Start: 2008-03-09 — End: 2008-03-15

## 2008-03-09 MED ORDER — TOPAMAX 25 MG OR TABS
ORAL_TABLET | ORAL | Status: DC
Start: 2008-03-09 — End: 2008-04-05

## 2008-03-09 MED ORDER — CITALOPRAM HYDROBROMIDE 40 MG OR TABS
ORAL_TABLET | ORAL | Status: DC
Start: 2008-03-09 — End: 2008-08-11

## 2008-03-09 MED ORDER — CELEXA 20 MG OR TABS
ORAL_TABLET | ORAL | Status: DC
Start: 2008-03-09 — End: 2008-08-11

## 2008-03-09 MED ORDER — LISINOPRIL 20 MG OR TABS
ORAL_TABLET | ORAL | Status: DC
Start: 2008-03-09 — End: 2008-08-11

## 2008-03-09 MED ORDER — METHADONE HCL 10 MG OR TABS
ORAL_TABLET | ORAL | Status: DC
Start: 2008-03-09 — End: 2008-05-31

## 2008-03-09 MED ORDER — BACLOFEN 20 MG OR TABS
ORAL_TABLET | ORAL | Status: DC
Start: 2008-03-09 — End: 2008-08-11

## 2008-03-09 MED ORDER — VENTOLIN HFA 108 (90 BASE) MCG/ACT IN AERS
INHALATION_SPRAY | RESPIRATORY_TRACT | Status: DC
Start: 2008-03-09 — End: 2008-08-11

## 2008-03-09 NOTE — Telephone Encounter (Signed)
needs refills. switching pharmacies--[facility] [address] # Patrick Garrett - [phone]

## 2008-03-09 NOTE — Progress Notes (Addendum)
Patrick Garrett is a 37 year old male who presents for ongoing management of chronic pain.       Interval history/ chief complaint(s): Completed dilaudid taper. Has been off dilauded. Notes that methadone is not helping pain. Notes constant pain 8-9/10.  Pt notes side chest wall pain as well as pain in lower back and legs. Tried Nortryptilline came off as did not help. Gabapentin did not help. Watches TV, in wheelchair. Able to walk a little with cane.   Worse pain w/ standing in one place.  Feels as though methadone is causing worsening sweating, feeling hot.  No n/v/constipation.   Pain control:  Above.   Functional goals and status: Goal to be more active.  Pt notes last month has cancelled appointments, feels miserable.  Notes aching pain in lower extremities.  Numbness in legs.       Diagnosis for chronic pain: lumbar interspinous ligament laxity, secondary myofascial back pain and sacraliliac joint instability and now it seems like there is a lumbar disc deformity/protrusion in L5/S1 region.   Current treatment plan : Methadone,  Possibly add topamax  Refill requests: Methadone as well as other medications.     2. GERD: Notes that continuing to wake up in AM w/ reflux sx despite 40mg  protonix.  Did not leave stool sample for h.pylori as we had discussed last visit. No change in BM. No dark stools. No hematochezia. No abdominal pain.     Patient Active Problem List   Diagnoses Code   . CHRONIC PAIN SYNDROME 338.4   . DEPRESSIVE DISORDER NEC 311   . HYPERTENSION NOS 401.9   . CHRONIC AIRWAY OBSTRUCTION NEC 496   . ESOPHAGEAL REFLUX 530.81            Current outpatient prescriptions: PANTOPRAZOLE SODIUM 40 MG OR TBEC, Take 1 tablet by mouth every morning, Disp: 30, Rfl: 6;  METHADONE HCL 10 MG OR TABS, Take three tablets three times a day., Disp: 270, Rfl: 0;  CELEXA 20 MG OR TABS, Please take one tablet once a day.   (total dose with the 40mg  tablet is 60mg  / day)., Disp: 30, Rfl: 1;  BACLOFEN 20 MG OR TABS, Please take  one tablet three times a day., Disp: 90, Rfl: 6  LISINOPRIL 20 MG OR TABS, 1 tab daily, Disp: 30, Rfl: 6;  VENTOLIN HFA 108 (90 BASE) MCG/ACT IN AERS, One to two puffs Q four to six hours prn sob/ wheezing., Disp: 1 inhaler, Rfl: 6;  SYMBICORT 160-4.5 MCG/ACT IN AERO, 2 puffs in am and 2 puffs in pm, Disp: 1 inhaler, Rfl: 6;  CITALOPRAM HYDROBROMIDE 40 MG OR TABS, 1 tab daily, Disp: 30, Rfl: 6    Pertinent ROS:  As above.     OBJECTIVE:  BP 139/92  Pulse 125--->90  GEN: [name]. Diaphoretic.   MSKE:  Chest wall ttp palpation over thoracotomy scar.  LE:  Spasms at hip w// leg flexion/ extension. Toe extension intact B.  4/5 strength w/ knee extension/flexion, hip flexion/ extension    ASSESSMENT/PLAN:   338.4 CHRONIC PAIN SYNDROME  -Pt is now off dilaudid.   -At this point could consider transitioning from methadone to another longer acting agent like MS Contin, however, given the amt of methadone pt is on the equivalency dose of other medications is quite high. I explained to Patrick Garrett that if methadone is not helping is pain, another opiate may not work, however I am willing to try the other long acting agent  we can offer which is MS Contin.  He most likely has developed quite a bit of tolerance/ hypersenstivity to methadone.  If he continues to have pain on MS Contin then he will need to understand this is will be considered an opiate tx failure and there will no longer be an indication to continue him on opiates.   -Will start topamax per pain consult recs  25mg  i po QD, w/ plan to increase to 50mg  i po QD if tolerating within a week.   -Continuing to encourage physical acitvity in patient.        2. GERD:  -Increase PPI to BID  -If not improving will need to stop baclofen as this may be exacerbating symptoms.   -If still not improving sx will pursue EGD.       The treatment goals and appropriate use of medications was reviewed with the patient today, with an emphasis on achieving functional  goals.    Follow-up visit in 1 month, or sooner prn.    Audie Box, MD

## 2008-03-09 NOTE — Progress Notes (Signed)
-------------------------------------------    Attending: Alvy Beal, MD  I discussed this patient's history and physical findings with the resident, as well as the plan for this patient.  I have reviewed and confirm the findings and plan as documented in the resident's note .  Key findings based upon this discussion: Agree with plan to cont. Methadone and add Topamax, for chronic pain.   -------------------------------------------

## 2008-03-09 NOTE — Progress Notes (Signed)
FOB Worksheet for MA Services  Arrived:  YES  Vital(s) &/or pain assessment:  YES  Lab, chart, reports &/or meds reviewed:  YES  Medication(s)  Symptoms, diagnosis, plan, meds &/or other explained/clarified:  NO  Room use 26-50 min (excludes wait time):  NO  Room use >50 min (excludes wait time):  NO  Chaperone: NO  Other procedures (not separately billed):  NO    MA-only visit  NO    Total number of YES responses: 3  Completed by: Aaylah Pokorny

## 2008-03-09 NOTE — Telephone Encounter (Signed)
Meds refilled to new pharmacy during todays visit.

## 2008-03-10 ENCOUNTER — Telehealth (HOSPITAL_BASED_OUTPATIENT_CLINIC_OR_DEPARTMENT_OTHER): Payer: Self-pay

## 2008-03-10 NOTE — Telephone Encounter (Signed)
i spoke w/ pharmacy regarding transition from methadone to MS Contin.   Will drop form 90mg  QD to 45QD immediately than every 3 days will drop by 10mg . Once down to 25mg  will start 30mg  BID MS Contin.  Once both meds overlapped x 3 days will stop the Methadone.  He can have antiemetics for withdrawal.  Instructed Mr. Boomhower that the max for MS contin will be 90mg  total dose.  If this does not work opiates will no longer be indicated for him.     I would like the front desk to contact Mr. Devine to schedule an appt w/ me ASAP.      Jaana Brodt,MD

## 2008-03-15 ENCOUNTER — Ambulatory Visit (HOSPITAL_BASED_OUTPATIENT_CLINIC_OR_DEPARTMENT_OTHER): Payer: Medicaid Other

## 2008-03-15 VITALS — BP 126/88 | HR 110

## 2008-03-15 MED ORDER — PANTOPRAZOLE SODIUM 40 MG OR TBEC
DELAYED_RELEASE_TABLET | ORAL | Status: DC
Start: 2008-03-15 — End: 2008-08-11

## 2008-03-15 MED ORDER — MS CONTIN 30 MG OR TBCR
EXTENDED_RELEASE_TABLET | ORAL | Status: DC
Start: 2008-03-15 — End: 2008-04-06

## 2008-03-15 MED ORDER — PHENERGAN 25 MG OR TABS
ORAL_TABLET | ORAL | Status: DC
Start: 2008-03-15 — End: 2008-04-15

## 2008-03-15 NOTE — Patient Instructions (Signed)
-  Drop Methadone down from 90mg  a day to 45 mg a day (15mg  TID). Take that for three days. Than decrease to 35mg  a day (10mg , 10mg , 15mg ). Take that for three day, than decrease to 25mg  a day (10, 15mg ). At this point start taking MS Contin 30mg  twice a day. Take both medications for three days. Then stop the methadone. Bring in the rest of the methadone to your visit.     -I will provide you with phenergan to help with nausea. Please let me know if your withdrawal sx are worsening.     -Then I will see you a back at the regular visit.     Kavitha Chunchu,MD

## 2008-03-15 NOTE — Progress Notes (Signed)
FOB Worksheet for MA Services  Arrived:  YES  Vital(s) &/or pain assessment:  YES  Lab, chart, reports &/or meds reviewed:  YES  Medication(s)  Symptoms, diagnosis, plan, meds &/or other explained/clarified:  NO  Room use 26-50 min (excludes wait time):  NO  Room use >50 min (excludes wait time):  NO  Chaperone: NO  Other procedures (not separately billed):  NO    MA-only visit  NO    Total number of YES responses: 3  Completed by: Erianna Jolly

## 2008-03-15 NOTE — Progress Notes (Signed)
-------------------------------------------    Attending: Illa Level, MD  I discussed this patient's history and physical findings with the resident, as well as the plan for this patient.  I have reviewed and confirm the findings and plan as documented in the resident's note .  Key findings based upon this discussion:  Chronic LBP nad thoracotomy site   Med management.consider transition from methadone to MS Contin BID discussed realistic limitations  -------------------------------------------

## 2008-03-16 NOTE — Progress Notes (Signed)
Patrick Garrett is a 37 year old male here to discuss the following:    338.4 CHRONIC PAIN SYNDROME  (primary encounter diagnosis)  Patient notes that methadone is no longer controlling pain. As we had discussed on the phone, we will try an alternative longer acting narcotic. Discussed with pharmacy a potential taper of  Methadone to transition to MS Contin. Patient has noticed diaphoresis, flushing on methadone. She is currently on 50 mg of Topamax tolerating well    530.81 ESOPHAGEAL REFLUX  Needs protonix refill, issue w/ script at pharmacy, continued sx as noted in prior note      REVIEW OF SYSTEMS:  As above    OBJECTIVE:  BP 126/88  Pulse 110  General: pleasant male. No distress. Flushed      ASSESSMENT AND PLAN:    338.4 CHRONIC PAIN SYNDROME  (primary encounter diagnosis)  -Will decrease methadone 45  mg once a day from 90mg  once a day. Will then decrease Q3days by 10mg . ONce at 25mg  will start 30mg  BID MS Contin. Overlap x 3 days. Then stop methadone. Pt to bring in rest of medications to clinic at next visit.   -advised Patrick Garrett that this medication does not help his pain, opiates will no longer be part of regimen  -given Phenergan for nausea  -advised on symptoms of possible withdrawal    530.81 ESOPHAGEAL REFLUX  -refill Protonix sent to pharmacy    Return to clinic in one month or as needed      Patrick Box, MD

## 2008-04-05 ENCOUNTER — Telehealth (HOSPITAL_BASED_OUTPATIENT_CLINIC_OR_DEPARTMENT_OTHER): Payer: Self-pay

## 2008-04-05 NOTE — Telephone Encounter (Signed)
Pharmacy faxed patient refill.  Please advise.  Thanks.

## 2008-04-06 ENCOUNTER — Ambulatory Visit (HOSPITAL_BASED_OUTPATIENT_CLINIC_OR_DEPARTMENT_OTHER): Payer: Medicaid Other

## 2008-04-06 VITALS — BP 147/97 | HR 102 | Temp 96.3°F

## 2008-04-06 MED ORDER — TOPAMAX 25 MG OR TABS
ORAL_TABLET | ORAL | Status: DC
Start: 2008-04-05 — End: 2008-05-31

## 2008-04-06 MED ORDER — MS CONTIN 30 MG OR TBCR
EXTENDED_RELEASE_TABLET | ORAL | Status: DC
Start: 2008-04-06 — End: 2008-05-04

## 2008-04-06 NOTE — Progress Notes (Signed)
Patrick Garrett is a 37 year old male here to discuss the following:    338.4 CHRONIC PAIN SYNDROME  (primary encounter diagnosis)  Pt completed transition from methadone to MS. Contin. Reports that w/ BID MS Contin he has noted an improvement in his symptoms. He reports that he is now able to get up and "do things." He has been able to play w/ his dog. Notes that he is hoping to continue to increase his activity. Only notes pain in his lower back, chest wall in thoracotomy closer to end of 12hrs. No constipation. No nausea. No diaphoresis as had been exp w/ Methadone.        REVIEW OF SYSTEMS:  As above.     OBJECTIVE:  BP 147/97  Pulse 102  Temp 96.3 F (35.7 C)  SpO2 98%  General: [name]. NAD. A/Ox3. Pt appears more comfortable.       ASSESSMENT AND PLAN:    338.4 CHRONIC PAIN SYNDROME  (primary encounter diagnosis)  -Plan to restart PT to improve on what I suspect has been deconditioning contributing to some of his pain.   -Continue MS Contin 30mg  BID x 1 month  -Pt referral placed     RTC in 1 month to f/u pain, additionally will need to review dx of copd, consider rechecking PFT.Marland Kitchen      Audie Box, MD

## 2008-04-06 NOTE — Progress Notes (Signed)
-------------------------------------------    Attending: Jessicaann Overbaugh E Neighbor, MD  I discussed this patient's history and physical findings with the resident, as well as the plan for this patient.  I have reviewed and confirm the findings and plan as documented in the resident's note .    -------------------------------------------

## 2008-04-06 NOTE — Progress Notes (Signed)
FOB Worksheet for MA Services  Arrived:  YES  Vital(s) &/or pain assessment:  YES  Lab, chart, reports &/or meds reviewed:  YES  Medication(s)  Symptoms, diagnosis, plan, meds &/or other explained/clarified:  NO  Room use 26-50 min (excludes wait time):  NO  Room use >50 min (excludes wait time):  NO  Chaperone: NO  Other procedures (not separately billed):  NO    MA-only visit  NO    Total number of YES responses: 3  Completed by: Goitom Fikak

## 2008-04-07 NOTE — Progress Notes (Addendum)
FOB Worksheet for [facility]  Scheduled 1 test, consult or follow-up appt (on date of service):  YES APPOINTMENT  Scheduled 2 or more test(s), consult(s), admission, surgery planning, pre-auth, &/or surgery scheduling (w/in 48 hrs of appt):  NO  Other procedures (not separately billed):  NO    Total number of YES responses: 1  Completed by: Lisa Fontanos

## 2008-04-07 NOTE — Progress Notes (Addendum)
Addended by: FONTANOS, LISA on: 04/07/2008      Modules accepted: Orders

## 2008-04-15 ENCOUNTER — Telehealth (HOSPITAL_BASED_OUTPATIENT_CLINIC_OR_DEPARTMENT_OTHER): Payer: Self-pay

## 2008-04-15 MED ORDER — PHENERGAN 25 MG OR TABS
ORAL_TABLET | ORAL | Status: DC
Start: 2008-04-15 — End: 2008-06-16

## 2008-04-15 NOTE — Telephone Encounter (Signed)
Pharmacy faxed patient refill.  Please advise.  Thanks.

## 2008-04-15 NOTE — Telephone Encounter (Signed)
Refilled med. CW

## 2008-05-04 ENCOUNTER — Ambulatory Visit (HOSPITAL_BASED_OUTPATIENT_CLINIC_OR_DEPARTMENT_OTHER): Payer: Medicaid Other

## 2008-05-04 ENCOUNTER — Encounter (HOSPITAL_BASED_OUTPATIENT_CLINIC_OR_DEPARTMENT_OTHER): Payer: Self-pay

## 2008-05-04 VITALS — BP 145/93 | HR 114 | Temp 95.7°F | Wt 217.0 lb

## 2008-05-04 MED ORDER — MS CONTIN 30 MG OR TBCR
EXTENDED_RELEASE_TABLET | ORAL | Status: DC
Start: 2008-05-04 — End: 2008-06-24

## 2008-05-04 NOTE — Progress Notes (Signed)
Patrick Garrett is a 37 year old male here to discuss the following:    338.4 CHRONIC PAIN SYNDROME  (primary encounter diagnosis)    37 yo M to clinic for f/u of pain. Pt notes that pain in L chest, paresthesias in B legs has been more tolerable on MS Contin. Notes that he has been able to perform more household chores. Feels as though he is using his cane more rather than wheelchair. Needs referral placed to PT in [place] so he can begin PT.  Has a care giver, approved 45 hours, 3 days/ week.  2 hours a day.        REVIEW OF SYSTEMS:  No f/c. No n/v. No constipation.     OBJECTIVE:  BP 145/93  Pulse 114--->90  Temp 95.7 F (35.4 C)  Wt 217 lb (98.431 kg)  SpO2 98%  General: [name]. NAD. A/Ox3  Back: TTP at L5, s1. FROM w/ flexion, notes pain at L5 level. Difficulty walking on B heels, toes.   Hip flexion: 4/5, hip extension 4/5. Knee extension/ knee flexion 4/5. Big toe extension 3/5 B.   DTRs: 1+ patellar, achilles    ASSESSMENT AND PLAN:    338.4 CHRONIC PAIN SYNDROME  (primary encounter diagnosis)  -Improved pain.   -Continue MS Contin BID 30mg   -Deconditioning may also play a role in pain as well as weakness in lower ext.   -Placed new PT referral.     RTC in one month    *pt to sign release of information so can obtain most recent PFTs.       Audie Box, MD

## 2008-05-04 NOTE — Patient Instructions (Signed)
Please sign a release of information prior to leaving so I can have a record of pulmonary function testing in our system.     See you in one month. Go ahead and schedule an appt w/ PT, referral in place.     Kavitha Chunchu,MD

## 2008-05-04 NOTE — Progress Notes (Signed)
-------------------------------------------    Attending: Illa Level, MD  I discussed this patient's history and physical findings with the resident, as well as the plan for this patient.  I have reviewed and confirm the findings and plan as documented in the resident's note .  Key findings based upon this discussion:  L sided thoracotomy ongiong pain anticipate further scar resvision as it matures, can inject with triam if localized pain, excessive scarring .  -------------------------------------------

## 2008-05-04 NOTE — Progress Notes (Signed)
FOB Worksheet for MA Services  Arrived:  YES  Vital(s) &/or pain assessment:  YES  Lab, chart, reports &/or meds reviewed:  YES  Medication(s)  Symptoms, diagnosis, plan, meds &/or other explained/clarified:  NO  Room use 26-50 min (excludes wait time):  NO  Room use >50 min (excludes wait time):  NO  Chaperone: NO  Other procedures (not separately billed):  NO    MA-only visit  NO    Total number of YES responses: 3  Completed by: Goitom Fikak

## 2008-05-10 ENCOUNTER — Encounter (HOSPITAL_BASED_OUTPATIENT_CLINIC_OR_DEPARTMENT_OTHER): Payer: Self-pay

## 2008-05-31 ENCOUNTER — Other Ambulatory Visit (HOSPITAL_BASED_OUTPATIENT_CLINIC_OR_DEPARTMENT_OTHER): Payer: Self-pay

## 2008-05-31 ENCOUNTER — Ambulatory Visit (HOSPITAL_BASED_OUTPATIENT_CLINIC_OR_DEPARTMENT_OTHER): Payer: Medicaid Other

## 2008-05-31 ENCOUNTER — Encounter (HOSPITAL_BASED_OUTPATIENT_CLINIC_OR_DEPARTMENT_OTHER): Payer: Self-pay

## 2008-05-31 VITALS — BP 132/93 | HR 92

## 2008-05-31 MED ORDER — REGLAN 5 MG OR TABS
ORAL_TABLET | ORAL | Status: DC
Start: 2008-05-31 — End: 2008-06-08

## 2008-05-31 MED ORDER — MS CONTIN 30 MG OR TBCR
EXTENDED_RELEASE_TABLET | ORAL | Status: DC
Start: 2008-05-31 — End: 2008-06-24

## 2008-05-31 MED ORDER — MS CONTIN 15 MG OR TBCR
EXTENDED_RELEASE_TABLET | ORAL | Status: DC
Start: 2008-05-31 — End: 2008-06-24

## 2008-05-31 NOTE — Patient Instructions (Addendum)
Please fast the night before. No food after midnight. Water is okay. Medications are okay. No coffee. No paperwork, labs drawn on first floor of [facility]. Lab open 8am- 5pm. If results are normal, expect a letter from me. If any concerns, I will call you.     Please do not hesitate to call with questions.       Taking your MS Contin every 8 hours so three times a day. Can try 15mg  in am, 15mg  at noon, 30mg  in evening Or 30mg  in am, 15mg  at noon, 15mg  in evening.         Kavitha Chunchu,MD

## 2008-05-31 NOTE — Progress Notes (Signed)
Patrick Garrett is a 38 year old male here to discuss the following:    338.4 CHRONIC PAIN SYNDROME  (primary encounter diagnosis)  37 yo  M to clinic for f/u of chronic pain. Pt notes that he feels as though pain medication is running out after 8 hours. Feels nauseated while waiting for additional dose. Notes that he sometimes experiences chills, sweats during this time.  He reports that he has been up ambulating.  Unable to start PT at [place] but has several other clinics he can contact.  Notes that he does feel as though he is withdrawing.  Has difficulty sleeping b/c of these. Walking 5 blocks to store. Increasing chores at home.  Previously had been unable to go anywhere due to pain being major limiting factor for activity. Notes that he has also stopped topamax as did not feel this medication helped his pain.     Back and legs.  Takes pain down to 1-2/10. Sometimes 4-5/10 for baseline.         REVIEW OF SYSTEMS:  As above.     OBJECTIVE:  BP 132/93  Pulse 92  General: [name]. Uncomfortable sitting in chair.   Back:   Inspection:  No gross joint deformity  Palpation: TTP along L5/s1  ROM:  Some improvement in flexion, extension.   Strength: 4/5 hip flexion/extension, knee/flexion extension,  Big toe extension B  Neurovasc: Did not assess DTRs today         ASSESSMENT AND PLAN:  338.4 CHRONIC PAIN SYNDROME  (primary encounter diagnosis)  -Improved activity level now w/ feeling that pain medication may not be lasting long enough  -Reglan for nausea, d/c phenergan  -For now will split am 30mg  dose to 15 mg in am, 15mg  at noon,  30mg  at night.   -If sx still persisting will consider keeping 30mg  BID but adding an additional dose. Pt does know from prior conversations limiting ms contin 90mg /day if pain is not controlled on this will consider opiate failure.   -continuing to pursue PT      HCM: family hx of early MI, check lipid panel.     RTC one month    Tierre Gerard,MD

## 2008-05-31 NOTE — Progress Notes (Signed)
FOB Worksheet for [employer] [facility]  Arrived:  YES  Vital(s) &/or pain assessment:  YES  Lab, chart, reports &/or meds reviewed:  NO  Symptoms, diagnosis, plan, meds &/or other explained/clarified:  YES   Symptom(s)  Room use 26-50 min (excludes wait time):  NO  Room use >50 min (excludes wait time):  NO  Chaperone: NO  Other procedures (not separately billed):  NO    MA-only visit  NO    Total number of YES responses: 3  Completed by: Deveron Furlong

## 2008-05-31 NOTE — Progress Notes (Signed)
-------------------------------------------    Attending: Wendall Papa, MD  I discussed this patient's history and physical findings with the resident, as well as the plan for this patient.  I have reviewed and confirm the findings and plan as documented in the resident's note .  Key findings based upon this discussion:  37 y/o s/p thoracotomy from COPD surgery and lumbar back pain from L4-5 disc bulge.  Moderate deconditioning now.  On chronic pain meds of Methadone 90 daily and 6 mg of hydromorphone daily for breakthrough.  Now on MS contin 30 mg BID with improved activity but feeling times of reduced pain control.  Will change to MS contin 15 mg BID in AM and noon and continue 30 mg QHS.  Recheck in 1 mo.  Vic Ripper, MD  -------------------------------------------

## 2008-06-03 NOTE — Progress Notes (Addendum)
FOB Worksheet for [facility]  Scheduled 1 test, consult or follow-up appt (on date of service):  YES APPOINTMENT  Scheduled 2 or more test(s), consult(s), admission, surgery planning, pre-auth, &/or surgery scheduling (w/in 48 hrs of appt):  NO  Other procedures (not separately billed):  NO    Total number of YES responses: 1  Completed by: Lisa Fontanos

## 2008-06-03 NOTE — Progress Notes (Addendum)
Addended by: FONTANOS, LISA on: 06/03/2008      Modules accepted: Orders

## 2008-06-08 ENCOUNTER — Telehealth (HOSPITAL_BASED_OUTPATIENT_CLINIC_OR_DEPARTMENT_OTHER): Payer: Self-pay

## 2008-06-08 MED ORDER — ZOFRAN 4 MG OR TABS
ORAL_TABLET | ORAL | Status: DC
Start: 2008-06-08 — End: 2008-08-11

## 2008-06-08 NOTE — Telephone Encounter (Signed)
Patrick Garrett is calling because "I need a new prescription."  He switched from phenergan to reglan on 9/29, and he has noted no improvement. In fact, things are worse. His nausea is especially bad in the morning. He has vomited his am meds several times; at this point, he will run out of his morphine one day before his appointment.  He is hoping that there is some other medication he can try for nausea.    Will route to Dr. Evette Cristal; we will get back to him.

## 2008-06-08 NOTE — Telephone Encounter (Signed)
LMTCB

## 2008-06-08 NOTE — Telephone Encounter (Signed)
Concerned this may be related to PUD, reflux.  Will try zofran. Additionally will see Pt on October 23 instead of 26 for appt. Will consider referral to GI at that time.     Zofran sent to pharmacy.     Tara Rud,MD

## 2008-06-08 NOTE — Telephone Encounter (Signed)
Pt calling again, really wants to speak with someone about this asap.

## 2008-06-08 NOTE — Telephone Encounter (Signed)
Pt c/o considerable nausea,He says that anti-nausea medication prescribed is not helping.

## 2008-06-09 NOTE — Telephone Encounter (Addendum)
Benadryl may help but can be sedating. Or pt can see if he can continue until app ton 10/23 w/ current meds. Alternative is for me to see him sometime next week.      Koben Daman,MD

## 2008-06-09 NOTE — Telephone Encounter (Addendum)
No zofran if pharmacy says will not be covered.

## 2008-06-09 NOTE — Telephone Encounter (Addendum)
Pharmacy states that pt's insurance will only pay for zofran if is for treatment for radiation or chemotherapy.  Per pharmacy, there are no other substitutions, nor is a PA an option.   Please advise.  Thanks.

## 2008-06-09 NOTE — Telephone Encounter (Addendum)
Left message for pt to advise and called pharmacy to advise.  Thanks.

## 2008-06-16 ENCOUNTER — Telehealth (HOSPITAL_BASED_OUTPATIENT_CLINIC_OR_DEPARTMENT_OTHER): Payer: Self-pay

## 2008-06-16 MED ORDER — PHENERGAN 25 MG OR TABS
ORAL_TABLET | ORAL | Status: DC
Start: 2008-06-16 — End: 2008-08-11

## 2008-06-16 NOTE — Telephone Encounter (Signed)
Pharmacy faxed patient refill.  Please advise.  Thanks.

## 2008-06-24 ENCOUNTER — Ambulatory Visit (HOSPITAL_BASED_OUTPATIENT_CLINIC_OR_DEPARTMENT_OTHER): Payer: Medicaid Other

## 2008-06-24 VITALS — BP 170/112 | HR 89 | Temp 98.1°F | Wt 220.0 lb

## 2008-06-24 MED ORDER — MS CONTIN 15 MG OR TBCR
EXTENDED_RELEASE_TABLET | ORAL | Status: DC
Start: 2008-06-24 — End: 2008-06-24

## 2008-06-24 MED ORDER — MS CONTIN 30 MG OR TBCR
EXTENDED_RELEASE_TABLET | ORAL | Status: DC
Start: 2008-06-24 — End: 2008-07-20

## 2008-06-24 MED ORDER — MS CONTIN 15 MG OR TBCR
EXTENDED_RELEASE_TABLET | ORAL | Status: DC
Start: 2008-06-24 — End: 2008-07-20

## 2008-06-24 NOTE — Patient Instructions (Signed)
Please continue to take your MS Contin three times a day.   I made the Physical Therapy Referral. Provide you with a Printed copy.     *For the stool test. Stop taking Protonix and tagamet for at least one week prior to the test. Stop off at the lab prior to leaving today to obtain the stool collection container, drop this off when you come in for next monthly appointment.     *Stop taking the baclofen gradually, Instead of taking three times a day. Go to twice a day for 1-2 weeks than once a day for one week, then off as this can exacerbate ulcers.     Kavitha Chunchu,MD
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
## 2008-06-24 NOTE — Progress Notes (Signed)
FOB Worksheet for [facility] Services  Arrived:  YES  Vital(s) &/or pain assessment:  YES  Lab, chart, reports &/or meds reviewed:  YES  Medication(s)  Symptoms, diagnosis, plan, meds &/or other explained/clarified:  NO  Room use 26-50 min (excludes wait time):  NO  Room use >50 min (excludes wait time):  NO  Chaperone: NO  Other procedures (not separately billed):  NO    MA-only visit  NO    Total number of YES responses: 3  Completed by: Kevin H Arnett
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
## 2008-06-27 ENCOUNTER — Telehealth (HOSPITAL_BASED_OUTPATIENT_CLINIC_OR_DEPARTMENT_OTHER): Payer: Self-pay

## 2008-06-27 ENCOUNTER — Encounter (HOSPITAL_BASED_OUTPATIENT_CLINIC_OR_DEPARTMENT_OTHER): Payer: Medicaid Other

## 2008-06-27 NOTE — Telephone Encounter (Signed)
Pt wants to report his bp is 162/95

## 2008-07-04 NOTE — Progress Notes (Addendum)
Addended by: GARDNER, TRISTEN on: 07/04/2008      Modules accepted: Orders

## 2008-07-04 NOTE — Progress Notes (Addendum)
FOB Worksheet for [facility]  Scheduled 1 test, consult or follow-up appt (on date of service):  YES Appointment  Scheduled 2 or more test(s), consult(s), admission, surgery planning, pre-auth, &/or surgery scheduling (w/in 48 hrs of appt):  NO  Other procedures (not separately billed):  NO    Total number of YES responses: 1  Completed by: Tristen Gardner

## 2008-07-20 ENCOUNTER — Ambulatory Visit (HOSPITAL_BASED_OUTPATIENT_CLINIC_OR_DEPARTMENT_OTHER): Payer: Medicaid Other

## 2008-07-20 VITALS — BP 127/86 | HR 96

## 2008-07-20 MED ORDER — MS CONTIN 15 MG OR TBCR
EXTENDED_RELEASE_TABLET | ORAL | Status: DC
Start: 2008-07-20 — End: 2008-08-11

## 2008-07-20 MED ORDER — MS CONTIN 30 MG OR TBCR
EXTENDED_RELEASE_TABLET | ORAL | Status: DC
Start: 2008-07-20 — End: 2008-08-11

## 2008-07-20 NOTE — Progress Notes (Signed)
Patrick Garrett is a 37 year old male here to discuss the following:    338.4 CHRONIC PAIN SYNDROME  (primary encounter diagnosis)  37 yo M who presents today to f/u on chronic pain. Pt reports he has established w/ [facility]. He has been working on stretching and weight lifting. He reports that he does feel some soreness after physical therapy. He reports he is going once a week. He has been given home exercises. He is planning to get weights. He is currently on 30mg  in am, 15mg  mid day, as well as 15mg  MS Contin in Evening. He had been on 30mg  BID but due to the fact he felt as though his medication was only lasting 8 hours tried to split the evening dose. He repots that he does still feel as though his pain is worse in the mornings. He notes that he has been trying to play with his puppy more frequently, working on cooking more in his home. He reports pain along his L thoractomy scar as well as continued LBP (hx of L5/S1 disc protrusion), has numbness in B lower extremities. He notes that he has aching pain at times. NO increased weakness in his lower extremities 7-8/10 .       He is ambulating more w/ his cane.        REVIEW OF SYSTEMS:  No f/c. Some nausea in am. No abdominal pain. No diarrhea.  No emesis.     OBJECTIVE:  BP 127/86  Pulse 96  General: [name]. NAD. A/Ox3  MSKE:  Improved flexion, extension, lateral bending in back.  4/5 hip flexion, extension,knee flexion, extension B    ASSESSMENT AND PLAN:    338.4 CHRONIC PAIN SYNDROME  (primary encounter diagnosis)  37 yo M w/ hx chronic pain in setting of L thoracotomy as well as LBP/ L5/S1 disc protrusion w/ overlay of deconditioning. Pt has been improving w/ physical therapy, less reliant on wheelchair. Having some increased pain w/ activity.   -Plan to increase am dose to 45mg  MS Contin, evening dose 30mg  MS Contin (total daily dose only increased to 75mg  from 60mg / day)  -Again emphasized that will continue to develop tolerance. As we had  discussed before, only planning on limiting MS Contin to 90mg /day at this point. Continuing to encourage activity.     RTC 1 month  Audie Box M.D. Jonathon Bellows          Audie Box, MD

## 2008-07-20 NOTE — Patient Instructions (Signed)
Please take 45 mg MS Contin am and 30mg  in Evening.     Please continue with physical therapy.     Audie Box M.D. Jonathon Bellows

## 2008-07-20 NOTE — Progress Notes (Signed)
FOB Worksheet for [employer]  Arrived:  YES  Vital(s) &/or pain assessment:  YES  Lab, chart, reports &/or meds reviewed:  YES  Medication(s)  Symptoms, diagnosis, plan, meds &/or other explained/clarified:  NO  Room use 26-50 min (excludes wait time):  NO  Room use >50 min (excludes wait time):  NO  Chaperone: NO  Other procedures (not separately billed):  NO    MA-only visit  NO    Total number of YES responses: 3  Completed by: Gracy Racer

## 2008-07-20 NOTE — Progress Notes (Signed)
-------------------------------------------    Attending: Daphane Shepherd, MD  I discussed this patient's history and physical findings with the resident, as well as the plan for this patient.  I have reviewed and confirm the findings and plan as documented in the resident's note .  Key findings based upon this discussion:  Chronic pain f/u.  Pleuritic origin s/p thoracotomy, also LBP. On MS Contin, doing well.  Improving functional status.   -------------------------------------------

## 2008-07-21 LAB — COMPREHENSIVE DRUG SCREEN, URN
Acetaminophen Qualitative, URN: NEGATIVE
Alcohol (Ethyl), URN: NEGATIVE mg/dL
Amphet/Methamphetamine Qual,URN: NEGATIVE
Barbiturate (Qual), URN: NEGATIVE
Benzodiazepines (Qual), URN: POSITIVE
Cannabinoids (Qual), URN: NEGATIVE
Cocaine (Qual), URN: NEGATIVE
Drug Screen Test Info, URN: POSITIVE
Drug Screen Test Info, URN: POSITIVE
Methadone (Qual), URN: NEGATIVE
Opiates (Qual), URN: POSITIVE
Phencyclidine (Qual), URN: NEGATIVE
Salicylate (Qual): NEGATIVE
Salicylate Qual Test Info, URN: POSITIVE
Tricyclic Antidepressants, URN: NEGATIVE

## 2008-07-22 NOTE — Progress Notes (Addendum)
Addended by: STEWART, DIANE on: 07/22/2008      Modules accepted: Orders

## 2008-07-22 NOTE — Progress Notes (Addendum)
FOB Worksheet for [facility]  Scheduled 1 test, consult or follow-up appt (on date of service):  YES appt  Scheduled 2 or more test(s), consult(s), admission, surgery planning, pre-auth, &/or surgery scheduling (w/in 48 hrs of appt):  NO  Other procedures (not separately billed):  NO    Total number of YES responses: 1    Completed by: Diane Stewart

## 2008-07-23 ENCOUNTER — Encounter (HOSPITAL_BASED_OUTPATIENT_CLINIC_OR_DEPARTMENT_OTHER): Payer: Medicaid Other

## 2008-08-11 ENCOUNTER — Other Ambulatory Visit (HOSPITAL_BASED_OUTPATIENT_CLINIC_OR_DEPARTMENT_OTHER): Payer: Self-pay

## 2008-08-11 ENCOUNTER — Encounter (HOSPITAL_BASED_OUTPATIENT_CLINIC_OR_DEPARTMENT_OTHER): Payer: Self-pay

## 2008-08-11 ENCOUNTER — Ambulatory Visit (HOSPITAL_BASED_OUTPATIENT_CLINIC_OR_DEPARTMENT_OTHER): Payer: Medicaid Other

## 2008-08-11 VITALS — BP 139/92 | HR 113 | Wt 215.0 lb

## 2008-08-11 MED ORDER — PROMETHAZINE HCL 25 MG OR TABS
ORAL_TABLET | ORAL | Status: AC
Start: 2008-08-11 — End: 2008-08-20

## 2008-08-11 MED ORDER — MS CONTIN 30 MG OR TBCR
EXTENDED_RELEASE_TABLET | ORAL | Status: DC
Start: 2008-08-18 — End: 2008-09-06

## 2008-08-11 MED ORDER — PANTOPRAZOLE SODIUM 40 MG OR TBEC
DELAYED_RELEASE_TABLET | ORAL | Status: DC
Start: 2008-08-11 — End: 2009-03-17

## 2008-08-11 MED ORDER — CITALOPRAM HYDROBROMIDE 40 MG OR TABS
ORAL_TABLET | ORAL | Status: DC
Start: 2008-08-11 — End: 2009-03-17

## 2008-08-11 MED ORDER — SILDENAFIL CITRATE 50 MG OR TABS
ORAL_TABLET | ORAL | Status: DC
Start: 2008-08-11 — End: 2009-01-18

## 2008-08-11 MED ORDER — VENTOLIN HFA 108 (90 BASE) MCG/ACT IN AERS
INHALATION_SPRAY | RESPIRATORY_TRACT | Status: DC
Start: 2008-08-11 — End: 2009-04-26

## 2008-08-11 MED ORDER — CITALOPRAM HYDROBROMIDE 20 MG OR TABS
ORAL_TABLET | ORAL | Status: DC
Start: 2008-08-11 — End: 2009-03-17

## 2008-08-11 MED ORDER — LISINOPRIL 20 MG OR TABS
ORAL_TABLET | ORAL | Status: DC
Start: 2008-08-11 — End: 2009-03-17

## 2008-08-11 MED ORDER — SYMBICORT 160-4.5 MCG/ACT IN AERO
INHALATION_SPRAY | RESPIRATORY_TRACT | Status: DC
Start: 2008-08-11 — End: 2009-01-18

## 2008-08-11 MED ORDER — MS CONTIN 15 MG OR TBCR
EXTENDED_RELEASE_TABLET | ORAL | Status: DC
Start: 2008-08-18 — End: 2008-09-06

## 2008-08-11 NOTE — Progress Notes (Signed)
-------------------------------------------    Attending: Tereasa Coop, MD  I discussed this patient's history and physical findings with the resident, as well as the plan for this patient.  I have reviewed and confirm the findings and plan as documented in the resident's note .  Key findings based upon this discussion:  Chronic pain, pain contract, med refill.  -------------------------------------------

## 2008-08-11 NOTE — Progress Notes (Signed)
FOB Worksheet for MA Services  Arrived:  YES  Vital(s) &/or pain assessment:  YES  Lab, chart, reports &/or meds reviewed:  YES  Medication(s)  Symptoms, diagnosis, plan, meds &/or other explained/clarified:  NO  Room use 26-50 min (excludes wait time):  NO  Room use >50 min (excludes wait time):  NO  Chaperone: NO  Other procedures (not separately billed):  NO    MA-only visit  NO    Total number of YES responses: 3  Completed by: Terrionna Bridwell    Pre-immunization screening questions were asked and answered.  VIS given to patient.  Patient has no allergies to vaccine or components.  Patient or their caregiver gave verbal consent for immunization.      I administered the injection.  Saydi Kobel, Medical Assistant

## 2008-08-11 NOTE — Progress Notes (Signed)
Patrick Garrett is a 38 year old male here to discuss the following:    V04.81 VACCINE FOR INFLUENZA  (primary encounter diagnosis)  H1N1 today    338.4 CHRONIC PAIN SYNDROME  Pt notes that his pain has improved since adjusting dose 45mg  MS Contin in am 30mg  in evening. He is continuing PT four times/ month. He reports that he is able to do more chores around his home. His pain has decreased from 8/10 baseline to 4/10. He is no longer feeling nauseated in the am. He is not having difficulty w/ bms.    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 He has had difficulty maintaining erections. He reports that he has no difficulty ejaculated.  Difficulty w/ erection also occurs during masturbation. Occuring over past few years, worsening. No concern for infection. No mass. No penile d/c. No pain w/ intercourse.     REVIEW OF SYSTEMS:  As above.     OBJECTIVE:  BP 139/92  Pulse 113  Wt 215 lb (97.523 kg)  General: Patrick Garrett. NAD. A/Ox3      ASSESSMENT AND PLAN:    V04.81 VACCINE FOR INFLUENZA  (primary encounter diagnosis)  h1n1 vaccine administered per orders       338.4 CHRONIC PAIN SYNDROME  Pt continues to improve.  Pain control as improved w/ last dose increase. Pt is aware of potential for tolerance to develop. Continues to increase physical activity.   Refilled MS Contin. Continue current regimen.    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     RTC 1 month     Audie Box, MD

## 2008-08-12 NOTE — Progress Notes (Addendum)
Addended by: STEWART, DIANE on: 08/12/2008      Modules accepted: Orders

## 2008-08-12 NOTE — Progress Notes (Addendum)
FOB Worksheet for [facility]  Scheduled 1 test, consult or follow-up appt (on date of service):  YES APPT  Scheduled 2 or more test(s), consult(s), admission, surgery planning, pre-auth, &/or surgery scheduling (w/in 48 hrs of appt):  NO  Other procedures (not separately billed):  NO    Total number of YES responses: 1    Completed by: Diane Stewart

## 2008-08-13 LAB — H. PYLORI AG, STL: H. Pylori Ag, Stool (Sendout): NEGATIVE

## 2008-09-06 ENCOUNTER — Encounter (HOSPITAL_BASED_OUTPATIENT_CLINIC_OR_DEPARTMENT_OTHER): Payer: Self-pay

## 2008-09-06 ENCOUNTER — Ambulatory Visit (HOSPITAL_BASED_OUTPATIENT_CLINIC_OR_DEPARTMENT_OTHER): Payer: Medicaid Other

## 2008-09-06 VITALS — BP 142/94 | HR 98

## 2008-09-06 MED ORDER — MS CONTIN 30 MG OR TBCR
EXTENDED_RELEASE_TABLET | ORAL | Status: DC
Start: 2008-09-06 — End: 2008-10-05

## 2008-09-06 MED ORDER — MS CONTIN 15 MG OR TBCR
EXTENDED_RELEASE_TABLET | ORAL | Status: DC
Start: 2008-09-06 — End: 2008-10-05

## 2008-09-06 NOTE — Progress Notes (Signed)
Patrick Garrett is a 38 year old male here to discuss the following:    338.4 CHRONIC PAIN SYNDROME  (primary encounter diagnosis)  Pt notes pain at baseline. Continuing PT. Continuing to increase chores around home. Pt states he is interested in going back on Dilaudid b/c had a faster onset for pain.  Needs new cane.         REVIEW OF SYSTEMS:  No constipation. No n/v.     OBJECTIVE:  BP 142/94  Pulse 98    General:Patrick Garrett. NAD. A/Ox3    ASSESSMENT AND PLAN:    338.4 CHRONIC PAIN SYNDROME  (primary encounter diagnosis)  -Order placed for new cane.   -Refilled MS Contin. Advised that generally per pain contract policy only management w/ long acting narcotic meds such as MS Contin and Methadone.  Weaned off Dilaudid.  Pt is really improved w/ his ROM and conditioning.     RTC 1 month.         Patrick Box, MD

## 2008-09-06 NOTE — Progress Notes (Signed)
-------------------------------------------    Attending: Wendall Papa, MD  I discussed this patient's history and physical findings with the resident, as well as the plan for this patient.  I have reviewed and confirm the findings and plan as documented in the resident's note .  Key findings based upon this discussion:  38 y/o on chronic pain meds in low back.  Stable on meds and improving ambulation with cane and PT.  Meds refilled and RTC in one mo.  Vic Ripper, MD   -------------------------------------------

## 2008-09-06 NOTE — Progress Notes (Signed)
FOB Worksheet for MA Services  Arrived:  YES  Vital(s) &/or pain assessment:  YES  Lab, chart, reports &/or meds reviewed:  YES  Medication(s)  Symptoms, diagnosis, plan, meds &/or other explained/clarified:  NO  Room use 26-50 min (excludes wait time):  NO  Room use >50 min (excludes wait time):  NO  Chaperone: NO  Other procedures (not separately billed):  NO    MA-only visit  NO    Total number of YES responses: 3  Completed by: Crosby Bevan

## 2008-09-13 ENCOUNTER — Telehealth (HOSPITAL_BASED_OUTPATIENT_CLINIC_OR_DEPARTMENT_OTHER): Payer: Self-pay

## 2008-09-13 NOTE — Telephone Encounter (Signed)
Patient still feeling sick wondering if he get some antibiotics

## 2008-09-13 NOTE — Telephone Encounter (Signed)
Needs to be evaluated. No abx over the phone.  Please have Mr. Nettleton schedule an appt.       Audie Box M.D. Jonathon Bellows

## 2008-09-14 ENCOUNTER — Telehealth (HOSPITAL_BASED_OUTPATIENT_CLINIC_OR_DEPARTMENT_OTHER): Payer: Self-pay

## 2008-09-14 NOTE — Telephone Encounter (Signed)
Pt calling asking to speak with nurse re cont'd sx's.He says that it started out as a cold but has gotten progressively worse.

## 2008-09-14 NOTE — Telephone Encounter (Signed)
Patrick Garrett reports that he has had a cold for almost three weeks.  It started with runny nose, sore throat, and a cough.  It gets better, then gets worse.  He saw Dr. Chesley Noon last Tuesday (09/06/08) and it was better then.  She decided he did not need an antibiotic.  Patrick Garrett reports that the next day his symptoms returned, and now he is worse.       Pt has COPD and is on oxygen (per patient).  He says he is afebrile.  He wanted a prescription for Motrin and an antibiotic phoned in for him.  I told him that he would need an appointment first, and offered him one tomorrow (so he could arrange [employer]).  He declined.       Discussed cutting down on his smoking, gargling with warm salt water, and sucking on throat lozenges to help his sore throat.  For his runny nose, he can use an antihistamine like Benadryl.  For his cough, staying hydrated and using a cough syrup might help.  If these fail to help, or he gets worse, he will need to come in for an appointment.  Pt understands this plan.

## 2008-09-14 NOTE — Telephone Encounter (Signed)
Spoke with patient. Informed he needs to be seen. He will call back and check schedule. Close encounter

## 2008-10-05 ENCOUNTER — Ambulatory Visit (HOSPITAL_BASED_OUTPATIENT_CLINIC_OR_DEPARTMENT_OTHER): Payer: Medicaid Other

## 2008-10-05 ENCOUNTER — Encounter (HOSPITAL_BASED_OUTPATIENT_CLINIC_OR_DEPARTMENT_OTHER): Payer: Self-pay

## 2008-10-05 VITALS — BP 127/83 | HR 95

## 2008-10-05 MED ORDER — MS CONTIN 30 MG OR TBCR
EXTENDED_RELEASE_TABLET | ORAL | Status: DC
Start: 2008-10-05 — End: 2008-11-03

## 2008-10-05 MED ORDER — MS CONTIN 15 MG OR TBCR
EXTENDED_RELEASE_TABLET | ORAL | Status: DC
Start: 2008-10-05 — End: 2008-11-03

## 2008-10-05 NOTE — Progress Notes (Signed)
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   Pt to clinic for f/u of back pain. Pain at baseline 3-5/10. Today 7/10. Notes am are harder for him.  Notes that working on PT. Notes that he is continuing to gain strength. Using cane for ambulation. Uses this in his R hand.  Feel as though this supports both of his legs.        V41.3 EAR PROBLEMS NEC  Reprots that last night , used a qtip w/o much of a cotton end, then noted blood from his L ear. No further blood or d/c . No hearing loss. No ringing. No pain in ears.  Qtip was not broken on removal.           REVIEW OF SYSTEMS:  As above.     OBJECTIVE:  BP 127/83  Pulse 95  General: [name]. NAD. A/ox3. Wearing O2 today.   HEENT:  L TM appear w/o erythema. No puncture. Inferior portion of canal, dried blood noted.   Pulm: CTAB  Back: TTP along paravertebral muscles L5/S1 level. No ttp on spinous processes. Flexion improved to 80 degrees can push to 90 degrees slowly.  Extension 20 degrees. Lateral bending, rotation wnl. Tandem gain w/ and w/o cane.  Uses cane, single point on R. No heel/toe walking today.  Negative romberg  Hip flexion 4/5, knee flexion 4/5. Knee extension 5/5. Toe dorsiflexion 4/5. Reflexes not assessed today.     ASSESSMENT AND PLAN:    338.4 CHRONIC PAIN SYNDROME  (primary encounter diagnosis)  -Continue PT  -Refilled MS Contin 45mg  in am, 30mg  in pm.   -Ongoing goals, to work on keeping weekly PT appts, plans to schedule appts later so can make more of an effort to attend.     V41.3 EAR PROBLEMS NEC  -Most likely trauma to canal, no perforation of TM visualized. No sx at this point. Advised on proper ear cleaning techniques    RTC 1 month    Audie Box, MD

## 2008-10-05 NOTE — Progress Notes (Signed)
------------------------------------------    Attending: Princetta Uplinger E Neighbor, MD  I saw and evaluated the patient with this resident.    I discussed the case with the resident, and have reviewed the resident's note.  I agree with the findings and plan as documented in the resident's note.    ----------------------------------------

## 2008-10-05 NOTE — Progress Notes (Addendum)
Addended by: NORDENSTROM, SHELLANIE P on: 10/05/2008      Modules accepted: Orders

## 2008-11-03 ENCOUNTER — Ambulatory Visit (HOSPITAL_BASED_OUTPATIENT_CLINIC_OR_DEPARTMENT_OTHER): Payer: Medicaid Other

## 2008-11-03 ENCOUNTER — Encounter (HOSPITAL_BASED_OUTPATIENT_CLINIC_OR_DEPARTMENT_OTHER): Payer: Self-pay

## 2008-11-03 VITALS — BP 133/89 | HR 81 | Wt 208.0 lb

## 2008-11-03 MED ORDER — IBUPROFEN 600 MG OR TABS
ORAL_TABLET | ORAL | Status: DC
Start: 2008-11-03 — End: 2008-12-20

## 2008-11-03 MED ORDER — MS CONTIN 15 MG OR TBCR
EXTENDED_RELEASE_TABLET | ORAL | Status: DC
Start: 2008-11-03 — End: 2008-11-28

## 2008-11-03 MED ORDER — MS CONTIN 30 MG OR TBCR
EXTENDED_RELEASE_TABLET | ORAL | Status: DC
Start: 2008-11-03 — End: 2008-11-28

## 2008-11-03 NOTE — Progress Notes (Signed)
-------------------------------------------    Attending: Barnett Abu, MD  I discussed this patient's history and physical findings with the resident, as well as the plan for this patient.  I have reviewed and confirm the findings and plan as documented in the resident's note .  Key findings based upon this discussion:  Chronic pain, back. Stable. Refill medications, no change in plan. F/u at regular interval.  -------------------------------------------

## 2008-11-03 NOTE — Progress Notes (Signed)
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 Pt here today for med refills.  Notes that he has had to space out his medications since it has been a little more than 30 days since his last visit. He reports his pain is 7/10, with medication <5/10.  Has not been to PT in a month but working on exercises daily.  Notes that he is spending less time in wheelchair. Using cane for ambulation.  Working a little on his car.  Feels as though he is progressing towards his goals of resuming some work.  Pt notes ibuprofen helping pain, would like to start script for this. He is not having difficulty with his reflux.  Had been on baclofen but Pt reports this did not help.         REVIEW OF SYSTEMS:  NO n/v. No f/c. No constipation    OBJECTIVE:  BP 133/89  Pulse 81  Wt 208 lb (94.348 kg)  General: [name]. NAD. A/Ox3.  Ambulating w/ single point cane, uses on R side.   Back: Flexion to 50 degrees. Extension, lateral bending wnl.     ASSESSMENT AND PLAN:  338.4 CHRONIC PAIN SYNDROME  (primary encounter diagnosis)  -Pt continues to improve. Encouraged continued activity. Additionally addressed that should work on coming in a few days prior to medications running out, can future date scripts to maintain 30 day intervals.   -Refilled MS Contin 45mg  in Am, 30mg  in PM x 1 month  -Ibuprofen 600mg  i tab po once a day prn pain, counseled on GI side effects.   -RTC 1 month    Audie Box M.D. Jonathon Bellows

## 2008-11-03 NOTE — Progress Notes (Signed)
FOB for MA during provider E/M visit    New or established:  Established (has been seen at [facility] within past 3 years)  Arrived in Epic Cadence:  Yes  Vital signs &/or pain assessment:  Yes  Lab, results, chart, reports, &/or medications reviewed:  Yes   Medications  Symptoms, diagnosis, plan, medications &/or other explained or clarified:  No  Chaperoning:  No  Other procedures, not separately billable, documented in EPICare:  No    Total number of YES responses:  3   (enter corresponding order; adjust if additional billable care is provided after entering the FOB order)Completed by Dalina Samara

## 2008-11-04 NOTE — Progress Notes (Addendum)
Addended by: NORDENSTROM, SHELLANIE P on: 11/04/2008      Modules accepted: Orders

## 2008-11-04 NOTE — Progress Notes (Addendum)
FOB for PSS during provider E/M visit    Scheduled 1 test, consult, follow-up, or recurrent appointment; transportation arrangement; or, processed incoming referral, on DAY OF SERVICE:  Yes  Follow-up appointment  Scheduled 2 or more tests, consults, admission, surgery, pre-authorization, transportation arrangement, or incoming referral within 48 hours of appointment:  No    Total number of YES responses:  1 (delete FOB order previously entered by MA & insert corrected FOB order reflecting combined total off all care provided by [facility] staff)  Completed by Shellanie P Nordenstrom

## 2008-11-28 ENCOUNTER — Encounter (HOSPITAL_BASED_OUTPATIENT_CLINIC_OR_DEPARTMENT_OTHER): Payer: Self-pay

## 2008-11-28 ENCOUNTER — Ambulatory Visit (HOSPITAL_BASED_OUTPATIENT_CLINIC_OR_DEPARTMENT_OTHER): Payer: Medicaid Other

## 2008-11-28 VITALS — BP 136/98 | HR 87 | Wt 206.7 lb

## 2008-11-28 MED ORDER — MS CONTIN 15 MG OR TBCR
EXTENDED_RELEASE_TABLET | ORAL | Status: DC
Start: 2008-12-02 — End: 2009-01-18

## 2008-11-28 MED ORDER — MS CONTIN 30 MG OR TBCR
EXTENDED_RELEASE_TABLET | ORAL | Status: DC
Start: 2008-12-02 — End: 2009-01-18

## 2008-11-28 MED ORDER — MS CONTIN 15 MG OR TBCR
EXTENDED_RELEASE_TABLET | ORAL | Status: DC
Start: 2008-12-02 — End: 2008-11-28

## 2008-11-28 MED ORDER — MS CONTIN 15 MG OR TBCR
EXTENDED_RELEASE_TABLET | ORAL | Status: DC
Start: 2008-11-28 — End: 2008-11-28

## 2008-11-28 NOTE — Progress Notes (Signed)
-------------------------------------------    Attending: Wilford Grist, MD  I discussed this patient's history and physical findings with the resident, as well as the plan for this patient.  I have reviewed and confirm the findings and plan as documented in the resident's note .  Key findings based upon this discussion:  Med refill.  -------------------------------------------

## 2008-11-28 NOTE — Progress Notes (Signed)
FOB for MA during provider E/M visit    New or established:  Established (has been seen at [facility] within past 3 years)  Arrived in Epic Cadence:  Yes  Vital signs &/or pain assessment:  Yes  Lab, results, chart, reports, &/or medications reviewed:  Yes   Medications  Symptoms, diagnosis, plan, medications &/or other explained or clarified:  No  Chaperoning:  No  Other procedures, not separately billable, documented in EPICare:  No    Total number of YES responses:  3 (ENTER CORRESPONDING FOB ORDER)  Completed by Patrick W O'Brien

## 2008-11-28 NOTE — Progress Notes (Addendum)
FOB for PSS during provider E/M visit    Scheduled 1 test, consult, follow-up, or recurrent appointment; transportation arrangement; or, processed incoming referral, on DAY OF SERVICE:  Yes  Follow-up appointment  Scheduled 2 or more tests, consults, admission, surgery, pre-authorization, transportation arrangement, or incoming referral within 48 hours of appointment:  No    Total number of YES responses:  1 (ENTER CORRESPONDING FOB ORDER)  Completed by Shellanie P Nordenstrom

## 2008-11-28 NOTE — Progress Notes (Signed)
Patrick Garrett is a 38 year old male here to discuss the following:    338.4 CHRONIC PAIN SYNDROME  (primary encounter diagnosis)  38 yo M to clinic for f/u of chronic pain. Pt notes that he thought that his am nausea had resolved but it has continued. He notes that he does not believe this is related to reflux or ibuprofen use. He reports that he feels as though he is withdrawing in the am. He reports he did not feel this way on methadone.  Had been on 90mg  methadone in past w/ dilaudid for breakthrough from prior providers.  He reports he feels chills, nauseated, no emesis in am.  He reports that medication has been helping with his pain except in the mornings.  Feels well controlled w/ 45mg  in am, has tried taking 30mg  later in evening which seemed to help.     REVIEW OF SYSTEMS:  As above.     OBJECTIVE:  BP 136/98  Pulse 87  Wt 206 lb 11.2 oz (93.759 kg)  General: [name]. NAD. A/Ox3    ASSESSMENT AND PLAN:    338.4 CHRONIC PAIN SYNDROME  (primary encounter diagnosis)  -Possible withdrawal type symptoms.   -Pt is aware that the max we had discussed for MS Contin had been 90mg .   -Have tried splitting evening dose previously so was taking 15mg  at usual time and 15mg  later but felt this exacerbated pain.   -At this point will continue 45mg  in am, increase to 45mg  in evening, pt can take at one time or split 30mg  in evening then 15mg  later.   Pt is aware of developing tolerance to medications.  He has been making progress with physical therapy,  He will not have further increases of MS Contin per our prior discussions.     RTC 1 month    Audie Box M.D. Jonathon Bellows

## 2008-11-30 ENCOUNTER — Encounter (HOSPITAL_BASED_OUTPATIENT_CLINIC_OR_DEPARTMENT_OTHER): Payer: Self-pay

## 2008-11-30 NOTE — Progress Notes (Addendum)
Addended by: SLAGLE, SUSAN A on: 11/30/2008      Modules accepted: Orders

## 2008-12-12 ENCOUNTER — Telehealth (HOSPITAL_BASED_OUTPATIENT_CLINIC_OR_DEPARTMENT_OTHER): Payer: Self-pay

## 2008-12-12 MED ORDER — PROMETHAZINE HCL 25 MG OR TABS
ORAL_TABLET | ORAL | Status: DC
Start: 2008-12-12 — End: 2009-01-18

## 2008-12-12 NOTE — Telephone Encounter (Signed)
Unsure what med he is referring to , please advise

## 2008-12-12 NOTE — Telephone Encounter (Signed)
Please advise filled

## 2008-12-12 NOTE — Telephone Encounter (Signed)
Patient would like to refill his anti nausea medication. He is not sure what is the name of it on file but he is out of it and would like to be called in his pharmacy. Please call pt at girlfriends's phone #  [phone]

## 2008-12-15 ENCOUNTER — Encounter (HOSPITAL_BASED_OUTPATIENT_CLINIC_OR_DEPARTMENT_OTHER): Payer: Medicaid Other

## 2008-12-15 ENCOUNTER — Ambulatory Visit (HOSPITAL_BASED_OUTPATIENT_CLINIC_OR_DEPARTMENT_OTHER): Payer: Medicaid Other

## 2008-12-15 VITALS — BP 125/85 | HR 98

## 2008-12-15 NOTE — Progress Notes (Signed)
-------------------------------------------    Attending: Riccardo Dubin, MD  I discussed this patient's history and physical findings with the resident, as well as the plan for this patient.  I have reviewed and confirm the findings and plan as documented in the resident's note .  Key findings based upon this discussion:  Reviewed pt  COPD and HTN status with Dr Estill Bamberg, MD    12/15/2008   -------------------------------------------

## 2008-12-15 NOTE — Progress Notes (Signed)
Patrick Garrett is a 38 year old male here to discuss the following:    1.) BP- Took two pills of lisinopril this am because he felt his BP was running high.  He also has experienced a nose bleed intermittently over the past three days. Self resolve within 10-15 min. He has not rechecked his BP regularly. 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 No vision changes. No dizziness. No HA.     2.) Chronic pain- Recently switched to MS Contin. Feels that his is causing him to have quite a bit of nausea. Recently went up by 15 mg per day. He wants to get off of it now and switch back to the methadone.     3.)Nausea/vomiting- Present for about three months now while on MS Contin. No hematemesis. No abd pain. Typically in the am.  He takes phengergan but that doesn't seem to help much.     Patient Active Problem List   Diagnoses Code   . CHRONIC PAIN SYNDROME 338.4   . DEPRESSIVE DISORDER NEC 311   . HYPERTENSION NOS 401.9   . CHRONIC AIRWAY OBSTRUCTION NEC 496   . ESOPHAGEAL REFLUX 530.81   . PEPTIC ULCER NOS 533.90   . 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 CITALOPRAM HYDROBROMIDE 20 MG OR TABS Please take one tablet by mouth once a day (Total daily dose 60mg = 20mg  +40mg )  30  6   . CITALOPRAM HYDROBROMIDE 40 MG OR TABS 1 tab daily  30  6   . Ibuprofen 600 MG OR TABS Take one tablet by mouth once a day as needed for pain. Take with food. If any abdominal pain, please notify PCP.  30  0   . LISINOPRIL 20 MG OR TABS 1 tab daily  30  6   . Morphine Sulfate CR (MS CONTIN) 15 MG OR TB12 Please take one tablet by mouth in the morning  and one in the evening  (Total am dose 45mg , total pm dose 45mg )  60  0   . Morphine Sulfate CR (MS CONTIN) 30 MG OR TB12 Please take one tablet twice a day for pain  60  0   . PANTOPRAZOLE SODIUM 40 MG OR TBEC Please take one tablet by mouth twice a day  60  6   . Promethazine HCl 25 MG OR TABS 1 TABLET EVERY 4 TO 6 HOURS AS  NEEDED for nausea  30  1   . SILDENAFIL CITRATE 50 MG OR TABS 50 mg once daily 1 hour   before sexual activity;  10  0   . SYMBICORT 160-4.5 MCG/ACT IN AERO 2 puffs in am and 2 puffs in pm  1 inhaler  11   . VENTOLIN HFA 108 (90 BASE) MCG/ACT IN AERS One to two puffs Q four to six hours prn sob/ wheezing.  1 inhaler  11           REVIEW OF SYSTEMS:  No CP or SOB. No abd pain. Normal BM's and urination.     OBJECTIVE:  BP 125/85  Pulse 98  General: healthy, alert, no distress, walking with cane  Eyes:  PERRLA, normal sclera  Nose: Mild erythema of mucosa bilaterally. No septal hematoma. No bleeding presently.   Throat clear  CV: RRR no m/g/r  Chest: CTAB no w/c/r      ASSESSMENT AND  PLAN:    401.9 HYPERTENSION NOS  (primary encounter diagnosis)  --Under improved control with lisinopril 40 mg daily. I feel it is appropriate to continue with this dosing.   --f/u with Dr. Chesley Noon next Tuesday  --Encouraged to get f/u checks at his local pharmacy over the weekend.     784.7 EPISTAXIS  --Resolved. BP under improved control.   --Encouraged to avoid trauma with picking of nose. Moisturize area with some vasoline.   --If he has repetative episodes then RTC.     Chronic pain-   --Pt desiring change in his chronic pain regimen. I told him he needs to address this with Dr. Chesley Noon. He felt she was in today per up front staff. He was quite irritated but this poor communication however he was quite pleasant during our discussion.         Ruthmary Occhipinti Clarisa Schools, MD

## 2008-12-20 ENCOUNTER — Encounter (HOSPITAL_BASED_OUTPATIENT_CLINIC_OR_DEPARTMENT_OTHER): Payer: Self-pay

## 2008-12-20 ENCOUNTER — Ambulatory Visit (HOSPITAL_BASED_OUTPATIENT_CLINIC_OR_DEPARTMENT_OTHER): Payer: Medicaid Other

## 2008-12-20 ENCOUNTER — Encounter (HOSPITAL_BASED_OUTPATIENT_CLINIC_OR_DEPARTMENT_OTHER): Payer: Medicaid Other

## 2008-12-20 VITALS — BP 135/84 | HR 106 | Wt 202.0 lb

## 2008-12-20 MED ORDER — METHADONE HCL 40 MG OR TBSO
ORAL_TABLET | ORAL | Status: DC
Start: 2008-12-20 — End: 2009-01-18

## 2008-12-20 MED ORDER — IBUPROFEN 600 MG OR TABS
ORAL_TABLET | ORAL | Status: DC
Start: 2008-12-20 — End: 2009-04-17

## 2008-12-20 NOTE — Progress Notes (Signed)
-------------------------------------------    Attending: Alvy Beal, MD  I discussed this patient's history and physical findings with the resident, as well as the plan for this patient.  I have reviewed and confirm the findings and plan as documented in the resident's note .  Key findings based upon this discussion:  agree with switch to methadone. Will turn MS Contin into pharmacy.  -------------------------------------------

## 2008-12-20 NOTE — Progress Notes (Signed)
Patrick Garrett is a 38 year old male here to discuss the following:    338.4 CHRONIC PAIN SYNDROME  (primary encounter diagnosis)  38 yo M w/ hx of L5/S1 disc protrusion, L chest wall pain from thoracotomy, as well as myofascial back pain. Pt notes that he has continued nausea in am despite increased MS contin dose. He reports that he does not feel as though his pain is as well controlled. Continues to note sensation of tingling down B lower extremities. Continuing to work on daily exercises. Reports did not have these side effects while on methadone, although did complain of diaphoresis when on methadone. He prefers to restart methadone.  He notes that he does not have any abdominal pain with his nausea. He does not notice any nausea after eating. He has not noticed any change in his bowel movements.     REVIEW OF SYSTEMS:  As above.     OBJECTIVE:  BP 135/84  Pulse 106  Wt 202 lb (91.627 kg)  General:[name]. A/Ox3. Using cane to ambulate  Back: TTP along L5 spinous process, paravertebral ttp. 90 flexion. Extension wnl. Lateral bending, rotation wnl. Pt can take a few steps on toes, heels, notes pain in lower back. 4/5 lower ext strength. 2+ DTRs throughout.     ASSESSMENT AND PLAN:    338.4 CHRONIC PAIN SYNDROME  (primary encounter diagnosis)  -Pt has been compliant w/ medication regimen. Requesting to go back on methadone.   -He is on adjunctive therapies to his narcotic  -Stop MS Contin, plans to turn in remaining week of pills to pharmacy  -Methadone 80mg /day. (Previously have been on 90mg /day)  -D/w patient that in terms of clinic policy, limited by long acting narcotics which can be offered. Overall his function has quite improved.  The goal is to continue to improve in this route. If he is not making progress in this area will need to reconsider role of narcotics in management of his pain.     RTC 1 month    Audie Box, MD

## 2008-12-22 ENCOUNTER — Encounter (HOSPITAL_BASED_OUTPATIENT_CLINIC_OR_DEPARTMENT_OTHER): Payer: Medicaid Other

## 2009-01-02 NOTE — Progress Notes (Addendum)
Addended by: SLAGLE, SUSAN A on: 01/02/2009      Modules accepted: Orders

## 2009-01-18 ENCOUNTER — Ambulatory Visit (HOSPITAL_BASED_OUTPATIENT_CLINIC_OR_DEPARTMENT_OTHER): Payer: Medicaid Other

## 2009-01-18 ENCOUNTER — Other Ambulatory Visit (HOSPITAL_BASED_OUTPATIENT_CLINIC_OR_DEPARTMENT_OTHER): Payer: Self-pay

## 2009-01-18 VITALS — BP 132/93 | HR 90 | Wt 191.0 lb

## 2009-01-18 MED ORDER — METHADONE HCL 10 MG OR TABS
ORAL_TABLET | ORAL | Status: DC
Start: 2009-01-18 — End: 2009-02-17

## 2009-01-18 NOTE — Progress Notes (Signed)
ARNETT DUDDY is a 38 year old male here to discuss the following:    338.4 CHRONIC PAIN SYNDROME  (primary encounter diagnosis)  38 yo M to clinic for f/u of chronic pain. Doing well. Notes pain 4/10. Nausea in am resolved since switching from MS Contin back to Methadone. Had been on 80mg /day. However, long time dose has been 90mg /day.  No issues w/ constipation. Working on stretching exercises.      REVIEW OF SYSTEMS:  As above.     OBJECTIVE:  BP 132/93  Pulse 115--->90  Wt 191 lb (86.637 kg)  General: [name]. NAD. A/Ox3. Ambulating w/ cane.     ASSESSMENT AND PLAN:    338.4 CHRONIC PAIN SYNDROME  (primary encounter diagnosis)  -Methadone from 80mg /day to 90mg /day total daily dose (verified w/ pharmacy only 10mg  tablets available. Will take iii tabs TID). Pt has been at this dose previously.   -Emphasized goal is not to be pain free but to continue to focus on his function.     HCM: lipid panel ordered.      RTC 1 month  Audie Box, MD

## 2009-01-18 NOTE — Patient Instructions (Signed)
Please fast the night before.  No food after midnight. Water is okay. Medications are okay. No coffee.  No paperwork, labs drawn on first floor of [facility]  Lab open 8am- 5pm.  If results are normal, expect a letter from me. If any concerns, I will call you.      Please do not hesitate to call with questions.

## 2009-01-18 NOTE — Progress Notes (Signed)
-------------------------------------------    Attending: Wilford Grist, MD  I discussed this patient's history and physical findings with the resident, as well as the plan for this patient.  I have reviewed and confirm the findings and plan as documented in the resident's note .  Key findings based upon this discussion:  F/u chronic pain.  -------------------------------------------

## 2009-01-20 NOTE — Progress Notes (Addendum)
Addended by: SLAGLE, SUSAN A on: 01/20/2009      Modules accepted: Orders

## 2009-02-13 ENCOUNTER — Encounter (HOSPITAL_BASED_OUTPATIENT_CLINIC_OR_DEPARTMENT_OTHER): Payer: Medicaid Other

## 2009-02-17 ENCOUNTER — Ambulatory Visit (HOSPITAL_BASED_OUTPATIENT_CLINIC_OR_DEPARTMENT_OTHER): Payer: Medicaid Other

## 2009-02-17 ENCOUNTER — Encounter (HOSPITAL_BASED_OUTPATIENT_CLINIC_OR_DEPARTMENT_OTHER): Payer: Self-pay

## 2009-02-17 VITALS — BP 155/95 | HR 102 | Wt 192.0 lb

## 2009-02-17 MED ORDER — METHADONE HCL 10 MG OR TABS
ORAL_TABLET | ORAL | Status: DC
Start: 2009-02-17 — End: 2009-03-17
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
## 2009-02-17 NOTE — Progress Notes (Signed)
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 The plan is to refilled his meds.  F/u in 1 mo  reviewed warning signs seek care if worse     Crissie Reese MD [facility] Attending

## 2009-02-17 NOTE — Progress Notes (Signed)
Health maint----lipids due---pt not fasting today.

## 2009-03-09 ENCOUNTER — Telehealth (HOSPITAL_BASED_OUTPATIENT_CLINIC_OR_DEPARTMENT_OTHER): Payer: Self-pay

## 2009-03-09 MED ORDER — PROMETHAZINE HCL 25 MG OR TABS
ORAL_TABLET | ORAL | Status: DC
Start: 2009-03-09 — End: 2009-03-18

## 2009-03-09 NOTE — Telephone Encounter (Signed)
Pharmacy is requesting refills. Please see attached. Please advise.  Fax received 03/08/09  Thank you,   Marci   Nurse Tech

## 2009-03-09 NOTE — Telephone Encounter (Signed)
Prescription refilled.

## 2009-03-17 ENCOUNTER — Ambulatory Visit (HOSPITAL_BASED_OUTPATIENT_CLINIC_OR_DEPARTMENT_OTHER): Payer: Medicaid Other

## 2009-03-17 ENCOUNTER — Encounter (HOSPITAL_BASED_OUTPATIENT_CLINIC_OR_DEPARTMENT_OTHER): Payer: Medicaid Other | Admitting: Family Medicine

## 2009-03-17 VITALS — BP 139/81 | HR 85 | Wt 184.0 lb

## 2009-03-17 MED ORDER — PANTOPRAZOLE SODIUM 40 MG OR TBEC
DELAYED_RELEASE_TABLET | ORAL | Status: DC
Start: 2009-03-17 — End: 2009-03-17

## 2009-03-17 MED ORDER — LISINOPRIL 20 MG OR TABS
ORAL_TABLET | ORAL | Status: DC
Start: 2009-03-17 — End: 2009-06-15

## 2009-03-17 MED ORDER — METHADONE HCL 10 MG OR TABS
ORAL_TABLET | ORAL | Status: DC
Start: 2009-03-17 — End: 2009-04-17

## 2009-03-17 MED ORDER — OMEPRAZOLE 20 MG OR CPDR
DELAYED_RELEASE_CAPSULE | ORAL | Status: DC
Start: 2009-03-17 — End: 2009-06-15

## 2009-03-17 MED ORDER — CITALOPRAM HYDROBROMIDE 20 MG OR TABS
ORAL_TABLET | ORAL | Status: DC
Start: 2009-03-17 — End: 2009-06-15

## 2009-03-17 MED ORDER — IBUPROFEN 800 MG OR TABS
ORAL_TABLET | ORAL | Status: DC
Start: 2009-03-17 — End: 2009-06-15

## 2009-03-17 MED ORDER — PROMETHAZINE HCL 25 MG OR TABS
ORAL_TABLET | ORAL | Status: DC
Start: 2009-03-17 — End: 2009-04-26

## 2009-03-17 NOTE — Progress Notes (Addendum)
Addended by: Stefani Dama on: 03/17/2009      Modules accepted: Orders

## 2009-03-17 NOTE — Progress Notes (Signed)
Patrick Garrett is a 38 year old male here to discuss the following:    311 DEPRESSIVE DISORDER NEC  (primary encounter diagnosis)  Needs a refill on his SSRI. He is currently down to celexa, 20 mg daily. He had been on 60 mg daily  But felt that he did not need such a high dose so self tapered to 20 mg a day which he believves is a good dose that is keeping his mood stable.    530.81 ESOPHAGEAL REFLUX  He also would like a refill for his PPI.    338.4 CHRONIC PAIN SYNDROME  Pt has chronic back and leg pain for which he is on methadone. Has been on stable dose. He feels that his symptoms are well controlled, on time for his refills, no  Major flare ups. However in the am he does have increased pain for which he take ibuprofen 600 mg times one, but would like to go up to 800 mg for better control.  He is also requesting an rx for a cane--to help with ambulation and gait stability. In the past he had used canes but they were damaged so now he is borrowing his friend's.      REVIEW OF SYSTEMS:  Per HPI    OBJECTIVE:  BP 139/81  Pulse 85  Wt 184 lb (83.462 kg)  General: healthy, alert, no distress  Psychiatric:  Orients x 4.  Affect appropriate and not depressed.  Thought patterns normal and not tangential.        ASSESSMENT AND PLAN:    311 DEPRESSIVE DISORDER NEC  (primary encounter diagnosis)  Comment: On celexa 20 mg daily, stable.  Plan: FOB LEVEL III        Refilled celexa today.    530.81 ESOPHAGEAL REFLUX  Comment:   Plan: PANTOPRAZOLE SODIUM 40 MG OR TBEC  --pharmacy called to say that pantoprazole not covered please switch to omeprazole. This was done. He is at risk for GI sx due to chronic NSAID use.    338.4 CHRONIC PAIN SYNDROME  Comment: on time for his refill for methadone 30 mg TID which has been stable dose for him.  Plan: [facility] SUPPLY GENERIC---cane rx done and was given to patient.  --on time for refills today, was given 1 month supply of methadone 30 mg TID  --pt plans on following-up with Dr.  Chesley Noon at her new clinic in [place].      Durel Salts, MD

## 2009-03-17 NOTE — Progress Notes (Signed)
-------------------------------------------    Attending: Amie Portland, MD  I discussed this patient's history and physical findings with the resident, as well as the plan for this patient.  I have reviewed and confirm the findings and plan as documented in the resident's note .  Key findings based upon this discussion: methadone renewal.  -------------------------------------------

## 2009-04-17 ENCOUNTER — Encounter (HOSPITAL_BASED_OUTPATIENT_CLINIC_OR_DEPARTMENT_OTHER): Payer: Self-pay

## 2009-04-17 ENCOUNTER — Ambulatory Visit: Payer: Medicaid Other

## 2009-04-17 ENCOUNTER — Ambulatory Visit (HOSPITAL_BASED_OUTPATIENT_CLINIC_OR_DEPARTMENT_OTHER): Payer: Medicaid Other

## 2009-04-17 VITALS — BP 156/94 | HR 114

## 2009-04-17 DIAGNOSIS — G894 Chronic pain syndrome: Secondary | ICD-10-CM | POA: Insufficient documentation

## 2009-04-17 LAB — COMPREHENSIVE DRUG SCREEN, URN
Acetaminophen Qualitative, URN: NEGATIVE
Alcohol (Ethyl), URN: NEGATIVE mg/dL
Amphet/Methamphetamine Qual,URN: NEGATIVE
Barbiturate (Qual), URN: NEGATIVE
Benzodiazepines (Qual), URN: NEGATIVE
Cannabinoids (Qual), URN: NEGATIVE
Cocaine (Qual), URN: POSITIVE
Drug Screen Test Info, URN: POSITIVE
Drug Screen Test Info, URN: POSITIVE
Methadone (Qual), URN: POSITIVE
Opiates (Qual), URN: POSITIVE
Phencyclidine (Qual), URN: NEGATIVE
Salicylate (Qual): NEGATIVE
Salicylate Qual Test Info, URN: POSITIVE
Tricyclic Antidepressants, URN: NEGATIVE

## 2009-04-17 MED ORDER — METHADONE HCL 10 MG OR TABS
ORAL_TABLET | ORAL | Status: DC
Start: 2009-04-17 — End: 2009-05-16

## 2009-04-17 NOTE — Progress Notes (Signed)
SUBJECTIVE:  Chief Concern: Medication management    Patrick Garrett is a 38 year old male here to discuss the following:  1. Chronic Pain: Patient previously followed by Dr. Chesley Noon, most recently seen by Dr. Durward Fortes, and new PCP listed as Dr. Darrick Huntsman. He was scheduled today by administrative mistake, is due to refills, and thus I am seeing him instead of his regular provider.   Current Symptoms: low back pain, left chest pain, pain shooting down both legs  Things he is not able to do: stand for longer periods (eg. In line at the bank), sleeping at night  Regimen: Methadone 30mg  TID. Also uses Ibuprofen 800mg  every morning for breakthrough. He is also on Celexa.     OBJECTIVE:  BP 156/94  Pulse 114  General: somewhat jumpy, alert, no acute distress  Back exam:  -- Inspection: no deformity, no asymmetry, no bruising, no atrophy, + paraspinous muscle spasm along the L spine  -- ROM: Dorsolumbar flexion to 80 degrees. Extension to 10 degrees. Lateral tilt to 20 degrees.  -- Strength: Hip flexion (L2-3) 4+/5, Seated Knee extension (L4) 5/5, Seated Knee flexion (L5) 5/5, Foot dorsiflexion (S1) 5/5, Heal walking (L4) not tested.  -- Special tests: Bilateral supine straight leg raise: pain in back, no symptoms down legs. Neural slump test also causes LBP.  -- Palpation: no tenderness along the spinous processes of the C/T/L/S spine. + paraspinous muscle spasm noted.  -- Neurovascular exam: Sensation intact to light touch. Distal pulses intact 1+ patellar and AJ.     MRI 12/2007: "Multilevel degenerative disc disease without significant canal compromise or neural foraminal stenosis."    ASSESSMENT AND PLAN:  338.4 CHRONIC PAIN SYNDROME   Comment: Patient with chronic LBP - no stenosis or canal compromise on MRI 12/2007. He has good range of motion today on exam, would benefit from PT/activation. Discussed that pain meds are for increasing activation, and if not improving this, should be tapered. Mr. Stgermaine is not in PT, not  working, and reports no activity. Last Utox 07/2008, will repeat today.   Plan:   1. METHADONE HCL 10 MG OR TABS  2. COMPREHENSIVE DRUG SCREEN, URN  3. Patient to follow-up and establish with Dr. Durward Fortes as he has seen her once or Dr. Darrick Huntsman, who is listed as PCP. I stressed the importance of a single provider, updated pain contract with that provider, and active in PT.   4. New PT referral placed.     Noted elevated BP and HR after the visit, will address at next visit.     Darcella Cheshire, MD

## 2009-04-18 ENCOUNTER — Encounter (HOSPITAL_BASED_OUTPATIENT_CLINIC_OR_DEPARTMENT_OTHER): Payer: Medicaid Other | Admitting: Family Medicine

## 2009-04-25 ENCOUNTER — Encounter (HOSPITAL_BASED_OUTPATIENT_CLINIC_OR_DEPARTMENT_OTHER): Payer: Self-pay

## 2009-04-26 ENCOUNTER — Telehealth (HOSPITAL_BASED_OUTPATIENT_CLINIC_OR_DEPARTMENT_OTHER): Payer: Self-pay

## 2009-04-26 NOTE — Telephone Encounter (Signed)
Pharmacy is requesting refills.  Please see attached. Please advise.  Fax received 04/26/09.  Thank you,   Neta Mends - NT

## 2009-04-27 MED ORDER — PROMETHAZINE HCL 25 MG OR TABS
ORAL_TABLET | ORAL | Status: DC
Start: 2009-04-26 — End: 2009-06-15

## 2009-04-27 MED ORDER — VENTOLIN HFA 108 (90 BASE) MCG/ACT IN AERS
INHALATION_SPRAY | RESPIRATORY_TRACT | Status: DC
Start: 2009-04-26 — End: 2009-06-15

## 2009-04-27 NOTE — Telephone Encounter (Signed)
Prescription refilled.

## 2009-05-01 ENCOUNTER — Encounter (HOSPITAL_BASED_OUTPATIENT_CLINIC_OR_DEPARTMENT_OTHER): Payer: Self-pay

## 2009-05-01 NOTE — Progress Notes (Addendum)
-------------------------------------------    Attending: Vegas Coffin, MD  I discussed this patient's history and physical findings with the resident, as well as the plan for this patient.  I have reviewed and confirm the findings and plan as documented in the resident's note .    -------------------------------------------

## 2009-05-16 ENCOUNTER — Encounter (HOSPITAL_BASED_OUTPATIENT_CLINIC_OR_DEPARTMENT_OTHER): Payer: Self-pay

## 2009-05-16 ENCOUNTER — Ambulatory Visit: Payer: Medicaid Other

## 2009-05-16 VITALS — BP 145/89 | HR 101 | Wt 187.0 lb

## 2009-05-16 DIAGNOSIS — G894 Chronic pain syndrome: Secondary | ICD-10-CM | POA: Insufficient documentation

## 2009-05-16 MED ORDER — METHADONE HCL 10 MG OR TABS
ORAL_TABLET | ORAL | Status: DC
Start: 2009-05-16 — End: 2009-06-15

## 2009-05-16 NOTE — Progress Notes (Signed)
SUBJECTIVE:  Chief Concern: No chief complaint on file.    Patrick Garrett is a 38 year old male here to discuss the following:  1. Chronic Pain: Patient reports that all of his back pain is a complication of the epidural he had placed prior to his thoractomy for pneumothorax. Patient reportedly started pain meds 4-5 years ago. He reports he was first started in [place] on methadone, and this was shortly after his surgey. Then moved to [facility] and started getting methadone from [facility] about 3 years ago.  He reports that he was not getting methadone from [facility] due to prior drug/heroin addiction, but because this was who would given him methadone for his pain. There is some fuzzy history around someone in [place] providing Dilaudid at some point. He then started seeing Dr. Chesley Noon here at [facility].   Patient reports he recently moved. He therefore wants his physical therapy at a new place listed below:   [facility]  Elwood D. Rothmier, MD (family medicine, sports medicine)  [address], [address]  [place], [place] [address]  [phone]  [phone]    He was approached on several issues:   1. Recent utox + for cocaine, morphine, codeine. He initially said he did not know where the cocaine came from, but he went on to later clarify that he did not think his urine would have still been positive based on using it with an ex-girlfriend 2 weeks prior to that visit. He reports no ongoing use. We reviewed the pain contract, which he signed with Dr. Chesley Noon.     2. One provider: patient has not been seen yet by his identified provider since Dr. Chesley Noon left. He understands that he must seen one provider in our clinic.     3. Called patient's pharmacy, and they report only one provider other than Dr. Chesley Noon, which was in June - received oxycodone. He reports this was from the ER. Discussed that he is to call our clinic when he received opiates/benzos from outside clinics/ERs. He is to only get these in  emergencies.     4. Activation: Patient reports he is engaged in essentially no activity and he is not doing PT. We discussed that as a part of his pain contract he is receiving pain medications to help with activation. He needs to therefore show that these meds are assisting him with activity.     OBJECTIVE:  BP 145/89  Pulse 101  Wt 187 lb (84.823 kg)  General: alert, no distress    ASSESSMENT AND PLAN:  338.4 CHRONIC PAIN SYNDROME/ 724.2 LOW BACK PAIN(LUMBAGO)  Comment: Long discussion with this patient around compliance to his narcotic agreement. He understands that should he have any violation of his new contract he will no longer be able to receive medications from this clinic. We also discussed activation quite a bit and that this is the goal for the medications to allow him to move more, NOT full pain relief. He voices understanding.   Plan:   1. Refilled METHADONE HCL 10 MG OR TABS  2. COMPREHENSIVE DRUG SCREEN, URN. He understands that he will be screened monthly for a while, then randomly.   3. REFERRAL TO PHYSICAL THERAPY - he will be seen at least once prior to his next visit.   4. REFERRAL TO PAIN CLINIC - he will have an appointment by the next visit  5. He will establish care with his PCP at his next visit, and sign  a new pain contract  6. He will get all medicines at the [facility] pharmacy from this point forward     Darcella Cheshire, MD

## 2009-05-16 NOTE — Progress Notes (Signed)
-------------------------------------------    Attending: Noralee Space, MD  I discussed this patient's history and physical findings with the resident, as well as the plan for this patient.  I have reviewed and confirm the findings and plan as documented in the resident's note .  Key findings based upon this discussion:  38 y.o. Male with chronic back pain (MRI 12/2007 with " Multilevel degenerative disc disease without significant canal compromise or neural foraminal stenosis"), has broken pain contract secondary to use of multiple pain medications and cocaine use, case discussed by resident with Dr. Dola Factor and decided pt would be given a second chance, pain contract discussed and pt to have regular u. tox tests, if U. tox positive again patient will be discharged from clinic.  -------------------------------------------

## 2009-05-17 LAB — COMPREHENSIVE DRUG SCREEN, URN
Acetaminophen Qualitative, URN: NEGATIVE
Alcohol (Ethyl), URN: NEGATIVE mg/dL
Amphet/Methamphetamine Qual,URN: NEGATIVE
Barbiturate (Qual), URN: NEGATIVE
Benzodiazepines (Qual), URN: NEGATIVE
Cannabinoids (Qual), URN: NEGATIVE
Cocaine (Qual), URN: NEGATIVE
Drug Screen Test Info, URN: POSITIVE
Drug Screen Test Info, URN: POSITIVE
Methadone (Qual), URN: POSITIVE
Opiates (Qual), URN: POSITIVE
Phencyclidine (Qual), URN: NEGATIVE
Salicylate (Qual): NEGATIVE
Salicylate Qual Test Info, URN: POSITIVE
Tricyclic Antidepressants, URN: NEGATIVE

## 2009-06-12 ENCOUNTER — Encounter (HOSPITAL_BASED_OUTPATIENT_CLINIC_OR_DEPARTMENT_OTHER): Payer: Self-pay

## 2009-06-12 ENCOUNTER — Encounter (HOSPITAL_BASED_OUTPATIENT_CLINIC_OR_DEPARTMENT_OTHER): Payer: Medicaid Other

## 2009-06-15 ENCOUNTER — Telehealth (HOSPITAL_BASED_OUTPATIENT_CLINIC_OR_DEPARTMENT_OTHER): Payer: Self-pay

## 2009-06-15 ENCOUNTER — Ambulatory Visit: Payer: Medicaid Other | Attending: Family Medicine | Admitting: Family Medicine

## 2009-06-15 VITALS — BP 148/91 | HR 105 | Wt 184.0 lb

## 2009-06-15 DIAGNOSIS — Z23 Encounter for immunization: Secondary | ICD-10-CM | POA: Insufficient documentation

## 2009-06-15 MED ORDER — ATROVENT HFA 17 MCG/ACT IN AERS
INHALATION_SPRAY | RESPIRATORY_TRACT | Status: AC
Start: 2009-06-15 — End: ?

## 2009-06-15 MED ORDER — PROMETHAZINE HCL 25 MG OR TABS
ORAL_TABLET | ORAL | Status: AC
Start: 2009-06-15 — End: ?

## 2009-06-15 MED ORDER — METHADONE HCL 10 MG OR TABS
ORAL_TABLET | ORAL | Status: AC
Start: 2009-06-15 — End: ?

## 2009-06-15 MED ORDER — CITALOPRAM HYDROBROMIDE 20 MG OR TABS
ORAL_TABLET | ORAL | Status: AC
Start: 2009-06-15 — End: ?

## 2009-06-15 MED ORDER — IBUPROFEN 800 MG OR TABS
ORAL_TABLET | ORAL | Status: AC
Start: 2009-06-15 — End: ?

## 2009-06-15 MED ORDER — VENTOLIN HFA 108 (90 BASE) MCG/ACT IN AERS
INHALATION_SPRAY | RESPIRATORY_TRACT | Status: AC
Start: 2009-06-15 — End: ?

## 2009-06-15 MED ORDER — LISINOPRIL 20 MG OR TABS
ORAL_TABLET | ORAL | Status: AC
Start: 2009-06-15 — End: ?

## 2009-06-15 MED ORDER — OMEPRAZOLE 20 MG OR CPDR
DELAYED_RELEASE_CAPSULE | ORAL | Status: AC
Start: 2009-06-15 — End: ?

## 2009-06-15 NOTE — Telephone Encounter (Signed)
Referral coordinator from [facility] Center - wanted to know what is the difference from the old referral to the new referral placed in for Pain center as patient is already in methadone meds and has lots of hx of no shows. They have tried booking him for procedures but not showing with appointments. Please advise

## 2009-06-15 NOTE — Progress Notes (Signed)
Pre-immunization screening questions were asked and answered.  VIS given to patient.  Patient has no allergies to vaccine or components.  Patient or their caregiver gave verbal consent for immunization.      I administered the injection.  Kevin H Arnett, Medical Assistant

## 2009-06-15 NOTE — Telephone Encounter (Signed)
Called and spoke with coordinator. They will see him once to make recommendations regarding his pain meds and to consider injection as was discussed last year.

## 2009-06-15 NOTE — Progress Notes (Signed)
Chief Complaint   Patient presents with   . Refill Request           Patrick Garrett is a 38 year old male is here for the above concern/s:  Refill of medications.  Has COPD S/P surgery for bullous emphysema.   HTN controlled per patient  Chronic pain after lung surgery on pain contract   Depression well controlled. No suicidal or homicidal ideation.        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      ESOPHAGEAL REFLUX [530.81] 02/11/2008   Priority: High   Overview Note:   Negative h.pylori     CHRONIC AIRWAY OBSTRUCTION NEC [496] 09/27/2007   Priority: High   Overview Note:   Past hx of smoking. Symbicort, ventolin. 2L O2  PFTs: 12/14/07:    VC:   2.92  DLCO: 13.4  (46.7,  29%)  Done at [facility]. Interpreted as "severe decrease in diffusing capacity"... Need to resent PFTs to get FEV1 as not in current.        CHRONIC PAIN SYNDROME [338.4] 08/26/2007   Priority: High   Overview Note:   UTOX on 04/17/09: positive for cocaine, methadone, opiates. GCMS with codeine, morphine, cocaine.   NO MORE OPIATES!!!!!!!!        Pain contract signed 08/25/07.   Current Plan: Weaned off Dilaudid. Weaned off methadone 90mg /day. Had been experiencing diaphoresis. Poor pain control.  Also on Topamax (stopped), SSRI, now on MS Contin. Limit 90mg /day.    PT.   If pain is not controlled on MS Contin will be considered opiate failure.     PT at [facility].  MS contin causing nausea in AM. As of 4/ 2010, transitioned back to methadone. Pt informed no other narcotic options if this transition is not successful.    Pt had been on hydromorphone, methadone. Records verified from clinic. Pain since L sided thoracotomy four years ago (2005). Numbness, chest wall pain. Walks w/ cane, no longer using wheechair.   Utox 08/25/07: +methadone, opiates, tca (pt was on  hydromorphone)       Pain consult note 3/31: Bilateral ileocostalis trigger point injections   MRI 12/2007  Dx: lumbar interspinous ligament laxity, secondary myofascial back pain and sacraliliac joint instability and now it seems like there is a lumbar disc deformity/protrusion in L5/S1 region.     Pain meds stolen 06/24/08. First time. Filled meds five days early. No early refills per pain contract from this visit on.           DEPRESSIVE DISORDER NEC [311] 08/26/2007   Priority: High   Overview Note:   Pt planning on   establishing w/ counselor at beginning of year.  Celexa 40mg . -->increased to 60mg  Celexa.       HYPERTENSION NOS [401.9] 08/26/2007   Priority: High            Medications:     Prior Encounter Medications   Medication Sig Dispense Refill   . ALBUTEROL SULFATE (VENTOLIN HFA) 108 (90 BASE) MCG/ACT IN AERS One to two puffs Q four to six hours prn sob/ wheezing.  1 inhaler  11   . Citalopram Hydrobromide 20 MG OR TABS Please take one tablet by mouth once a day  30  6   . Ibuprofen 800 MG OR TABS Take 1 tablet by mouth once daily as needed for pain  90  2   . Lisinopril 20 MG OR TABS 1 tab daily  30  6   . Methadone HCl 10 MG OR TABS Please take iii tablets three times a day. (total daily dose 90mg /day)  270  0   . Omeprazole 20 MG OR CPDR Take one twice daily.  1 month supply  6   . Promethazine HCl 25 MG OR TABS Take 1 tablet every 4 to 6 hours as needed for nausea  60  0         Review of patient's allergies indicates:  No Known Allergies      OBJECTIVE: (2-4 systems)  Pain score per physician assessment: 2/10  BP 148/91  Pulse 105  Wt 184 lb (83.462 kg)  General: healthy, alert, mild distress  HEENT:   Lungs: Clear to Auscultation   Heart: Regular rate and rhythm. No murmurs  Abdomen:   Musculoskeletal:  Neuro:  Skin:      ASSESSMENT AND PLAN:  V04.81 VACCINE FOR INFLUENZA  (primary encounter diagnosis)  Comment:   Plan: FLU VACCINE, 3 YRS & >, IM, ADMIN INFLUENZA         VIRUS VAC             338.4 CHRONIC PAIN SYNDROME  Comment:   Plan: IBUPROFEN 800 MG OR TABS, METHADONE HCL 10 MG         OR TABS            496 CHRONIC AIRWAY OBSTRUCTION NEC  Comment:   Plan: VENTOLIN HFA 108 (90 BASE) MCG/ACT IN AERS,         ATROVENT HFA 17 MCG/ACT IN AERS            401.9 HYPERTENSION NOS  Comment:   Plan: LISINOPRIL 20 MG OR TABS            311 DEPRESSIVE DISORDER NEC  Comment:   Plan: CITALOPRAM HYDROBROMIDE 20 MG OR TABS            FU in one month with PCP      Orders Placed This Encounter   Medication   . Ventolin hfa 108 (90 base) mcg/act in aers     Sig: One to two puffs Q four to six hours prn sob/ wheezing.     Dispense:  1 inhaler     Refill:  11   . Citalopram hydrobromide 20 mg or tabs     Sig: Please take one tablet by mouth once a day     Dispense:  30     Refill:  6   . Ibuprofen 800 mg or tabs     Sig: Take 1 tablet by mouth once daily as needed for pain     Dispense:  90     Refill:  2   . Lisinopril 20 mg or tabs     Sig: 1 tab daily     Dispense:  30     Refill:  6   . Methadone hcl 10 mg or tabs     Sig: Please take iii tablets three times a day. (total daily dose 90mg /day)     Dispense:  270     Refill:  0   . Omeprazole 20 mg or cpdr     Sig: Take one twice daily.     Dispense:  1 month supply  Refill:  6     Note: this replaces previous pantoprazole order. Thanks.   . Promethazine hcl 25 mg or tabs     Sig: Take 1 tablet every 4 to 6 hours as needed for nausea     Dispense:  60     Refill:  0   . Atrovent hfa 17 mcg/act in aers     Sig: 1 puff QID     Dispense:  1     Refill:  11         Louann Hopson Nena Jordan, MD

## 2009-07-10 ENCOUNTER — Encounter (HOSPITAL_BASED_OUTPATIENT_CLINIC_OR_DEPARTMENT_OTHER): Payer: Medicaid Other

## 2009-07-14 ENCOUNTER — Encounter (HOSPITAL_BASED_OUTPATIENT_CLINIC_OR_DEPARTMENT_OTHER): Payer: Medicaid Other

## 2018-12-21 IMAGING — CR XR KNEE 2 VIEWS LEFT
1 series · 2 of 2 positions shown · non-contrast
Comparison: none

PAIN FROM ABSCESS RADIATING UP TO DISTAL FEMUR
EXAM: LEFT KNEE:
CLINICAL INDICATION: Pain in left knee
REFERENCE: None

[Series 3202: AP · right · 2 of 2 slices shown]
[im 1/2]
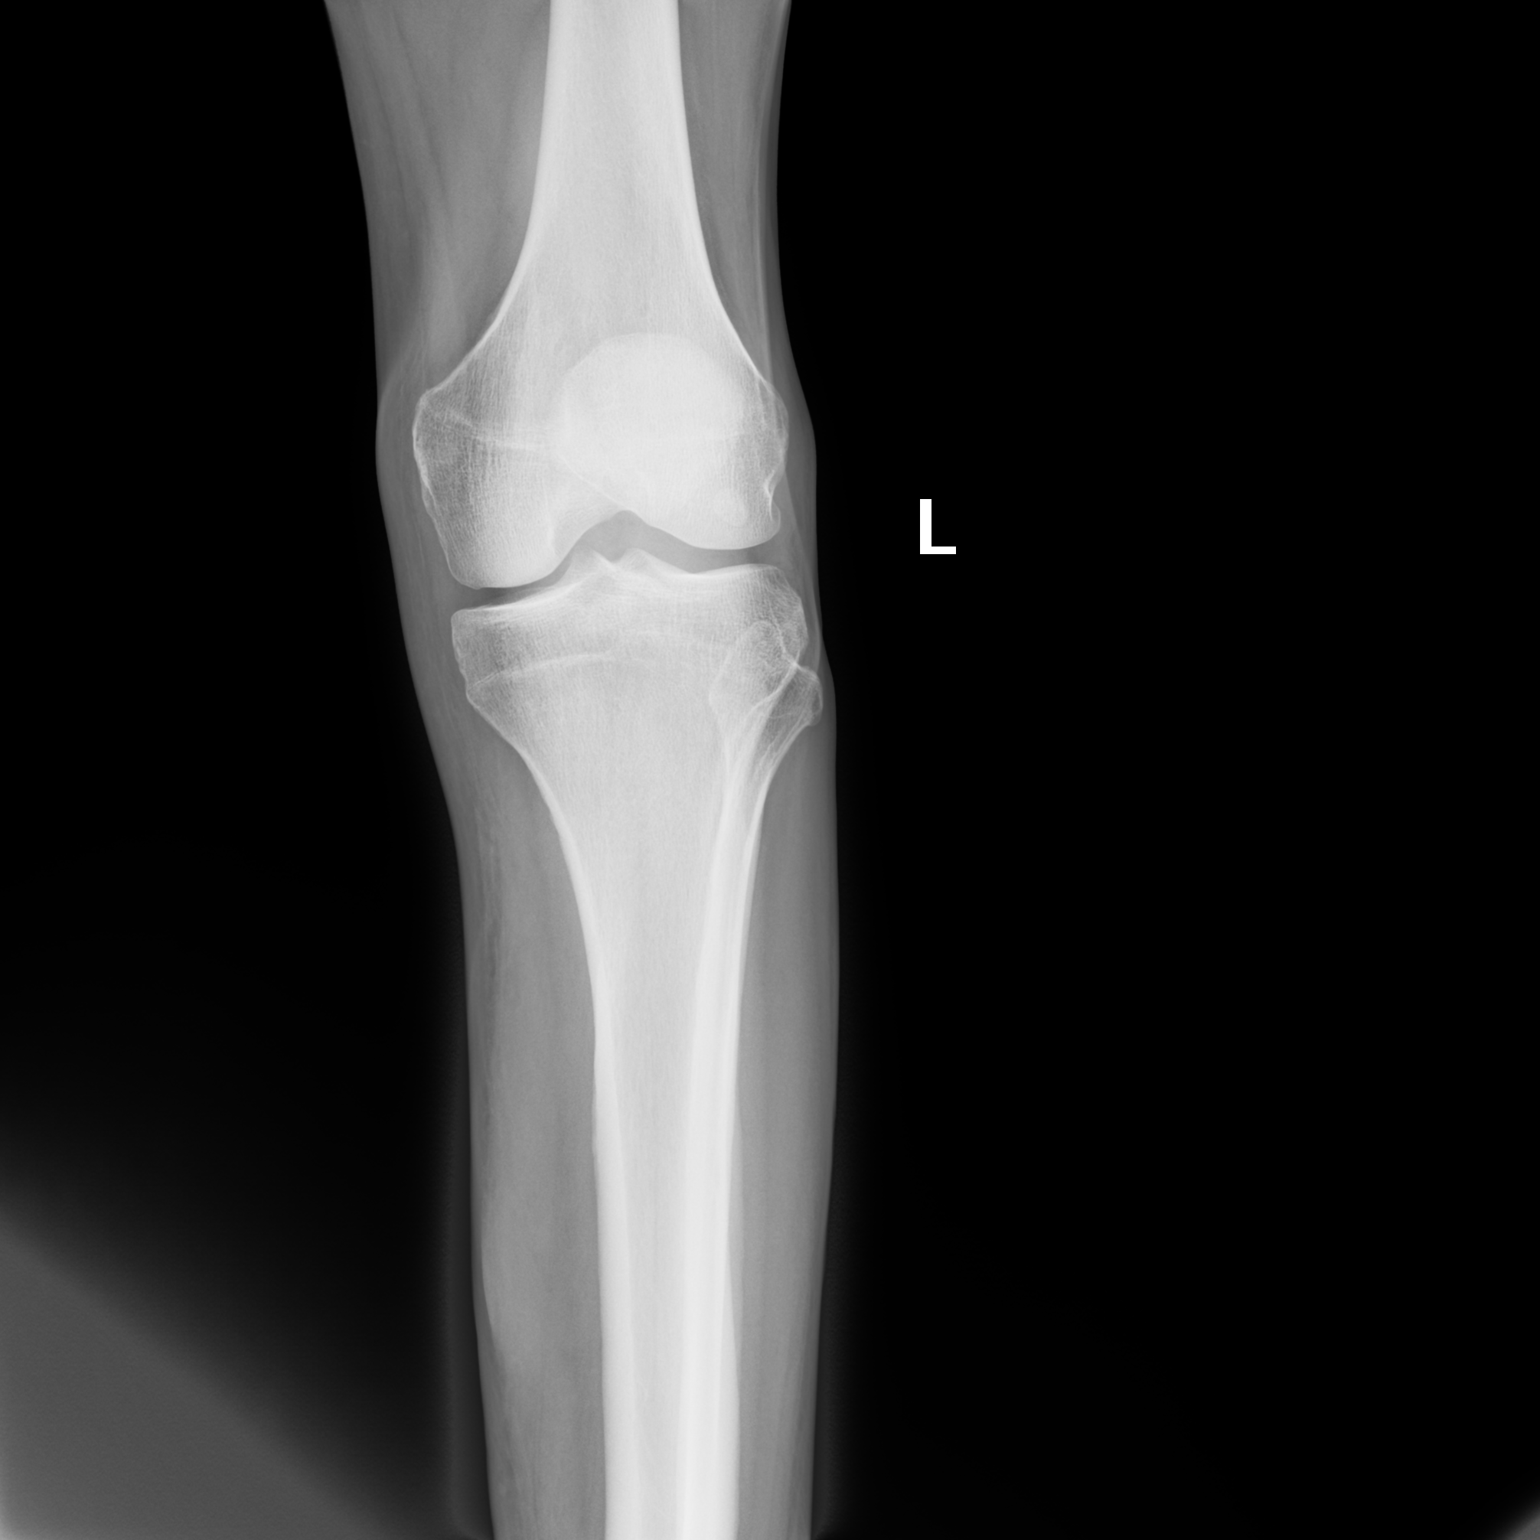
[im 2/2]
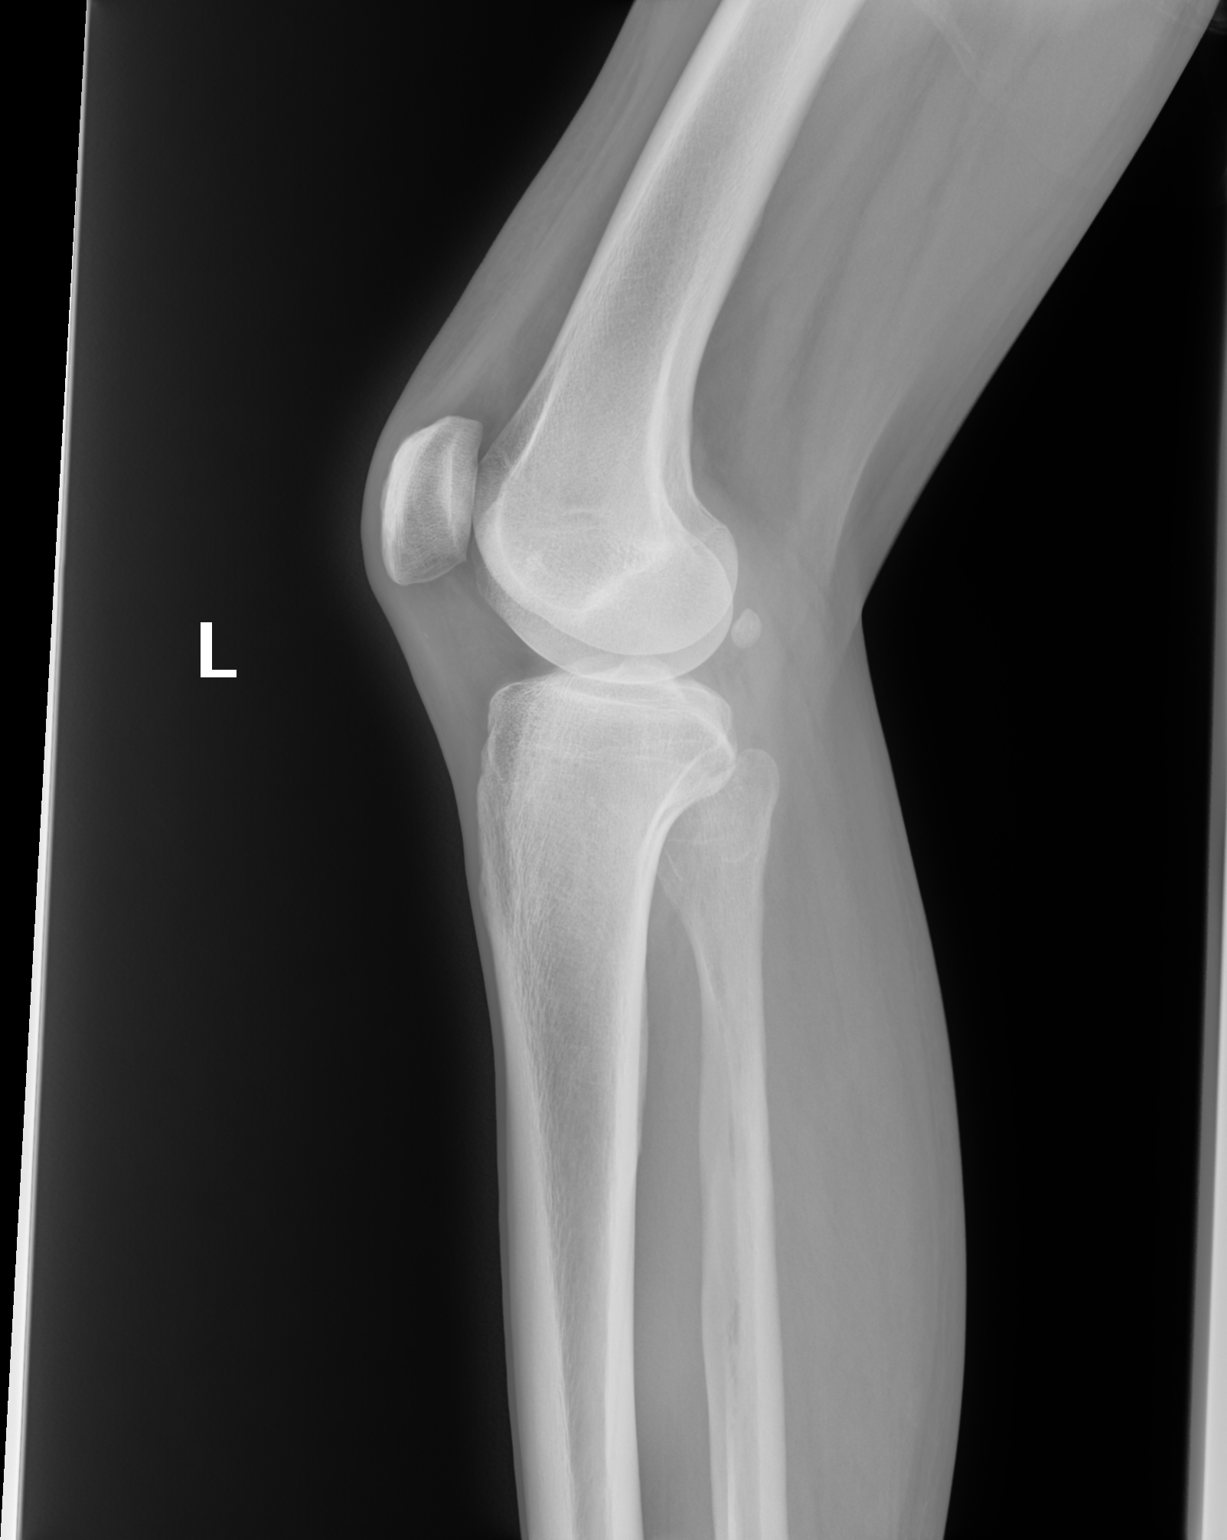

[2 of 2 positions shown; findings below may reference images not displayed]

FINDINGS: 2 Radiographs of the left knee demonstrate
normally aligned and intact osseous structures.
No fracture or dislocation is evident.
No large effusion or other soft tissue abnormality is present.
IMPRESSION: Unremarkable exam with no acute findings.
LOCATION CODE: 1

## 2021-11-16 ENCOUNTER — Telehealth (HOSPITAL_BASED_OUTPATIENT_CLINIC_OR_DEPARTMENT_OTHER): Payer: Self-pay

## 2021-11-16 NOTE — Telephone Encounter (Signed)
Called patient to schedule him a follow up with Dr. Susa Loffler. No answer, Lvm for him to call [phone]

## 2022-02-12 IMAGING — US US ABDOMEN COMPLETE
1 series · 14 of 25 positions shown · non-contrast
Comparison: None available

FINAL REPORT:
INDICATION: Chronic viral hepatitis C
Gastro-esophageal reflux disease with esophagitis, without bleeding
Encounter for screening for malignant neoplasm of colon
Nausea with vomiting, unspecified
Exam: Complete abdominal ultrasound
Submitted images:

[Series 1: us abdomen complete · 14 of 94 slices shown]
[im 1/94]
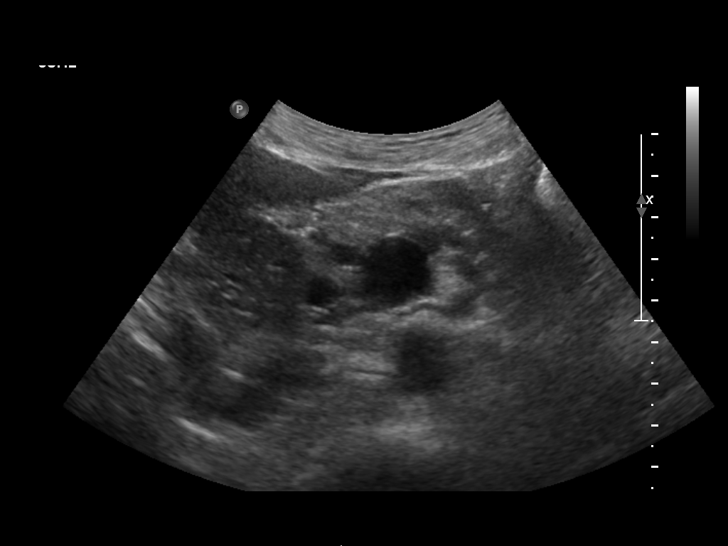
[im 8/94]
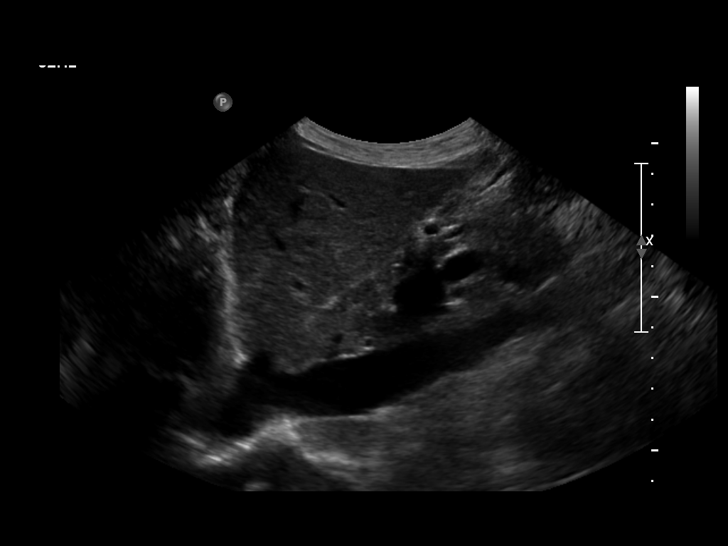
[im 16/94]
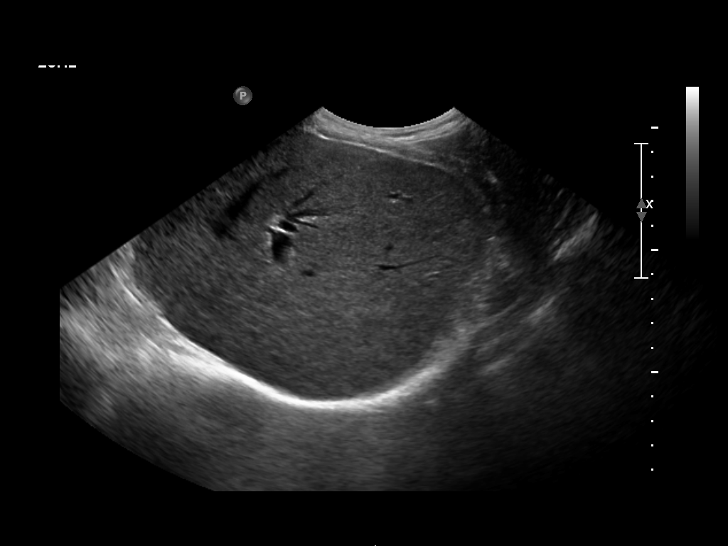
[im 24/94]
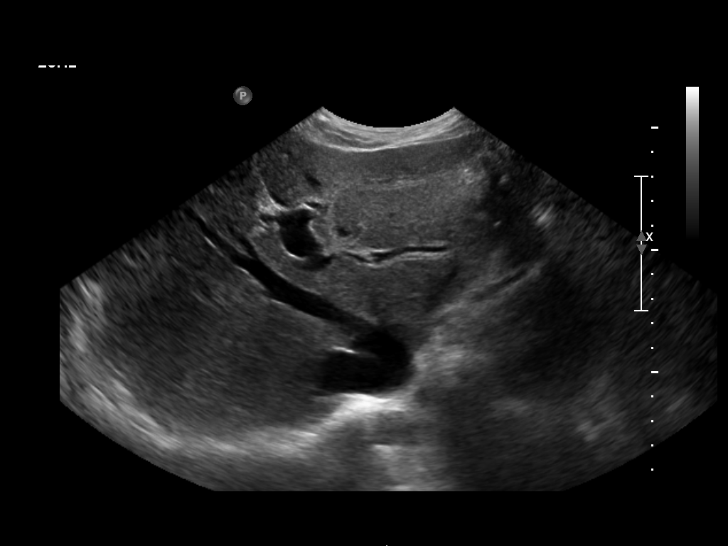
[im 32/94]
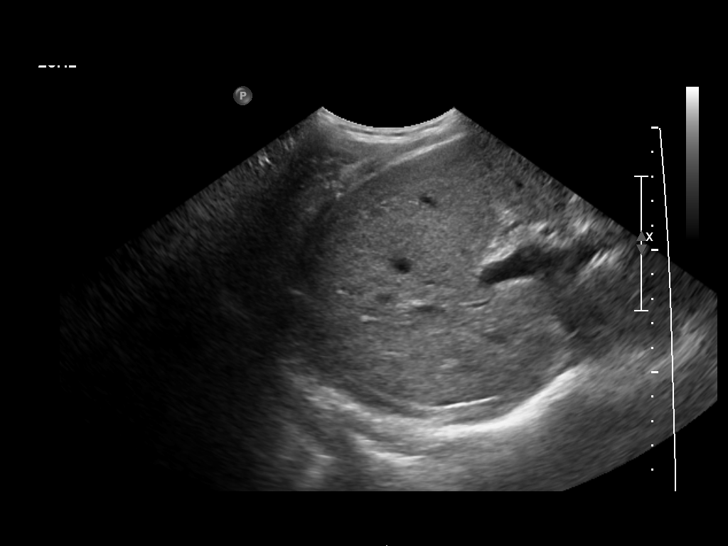
[im 35/94]
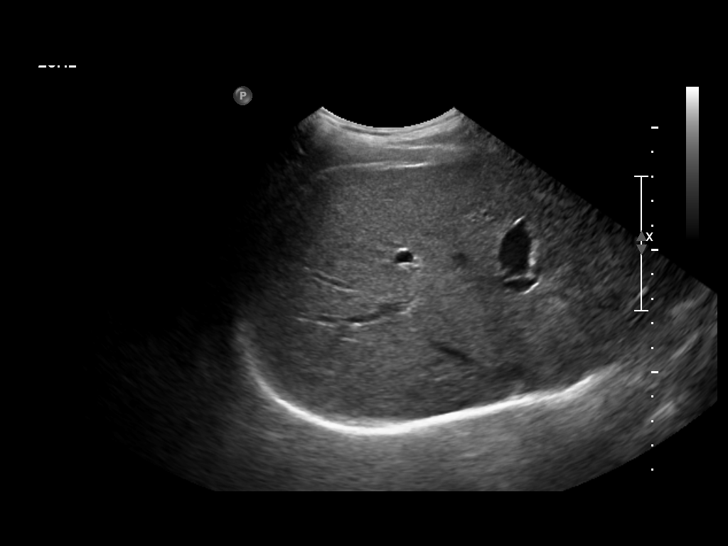
[im 43/94]
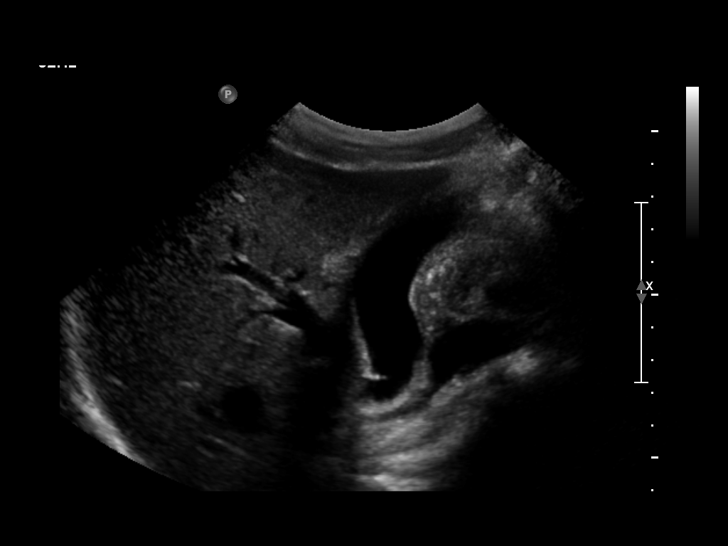
[im 51/94]
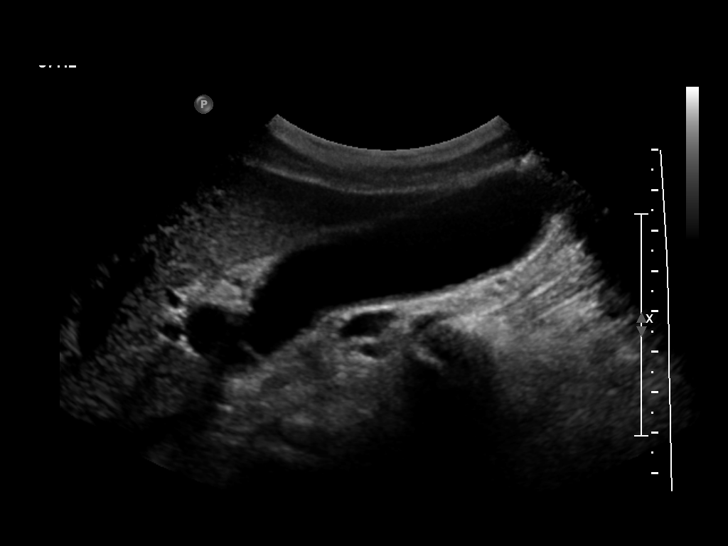
[im 59/94]
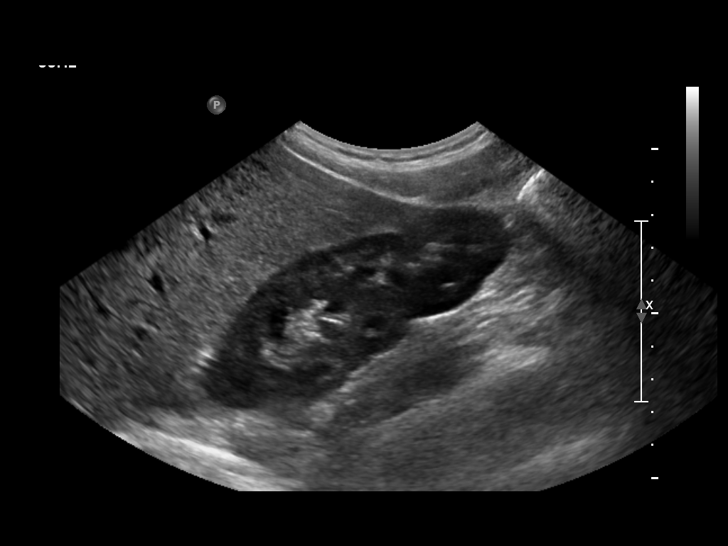
[im 63/94]
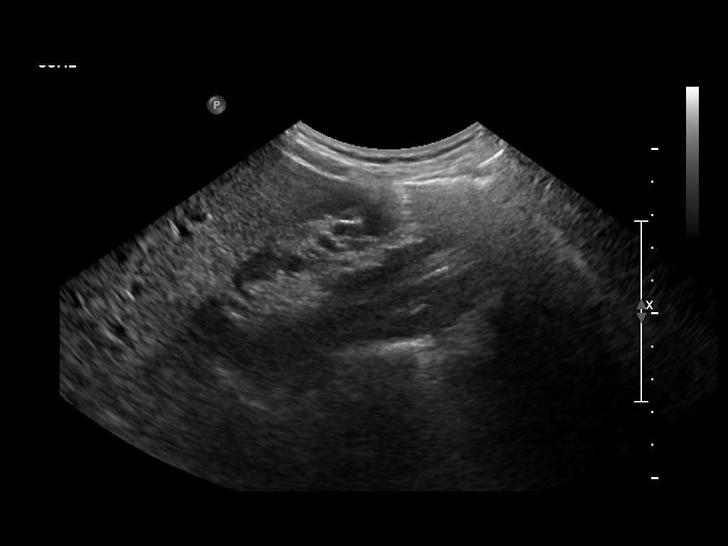
[im 70/94]
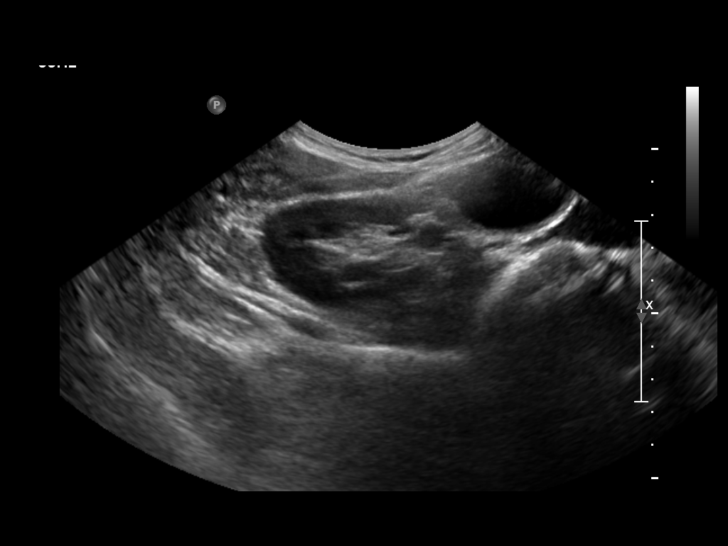
[im 78/94]
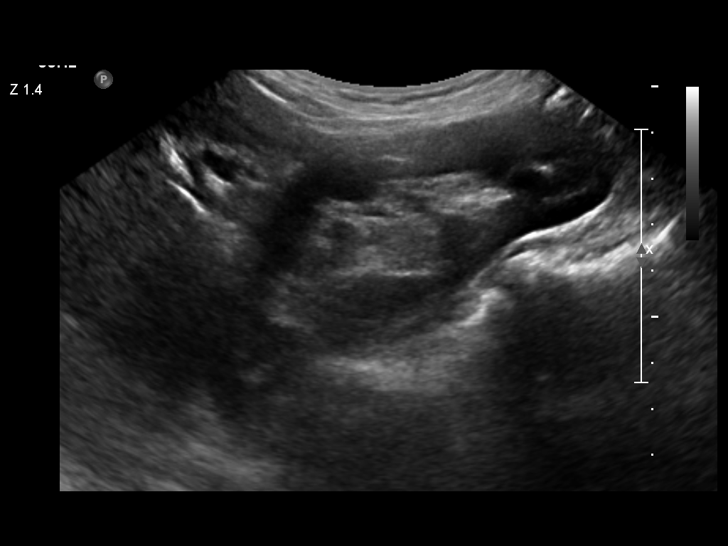
[im 86/94]
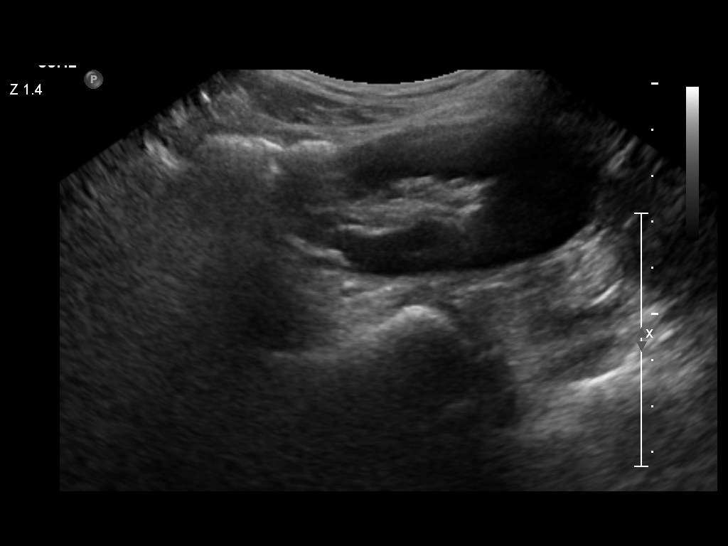
[im 94/94]
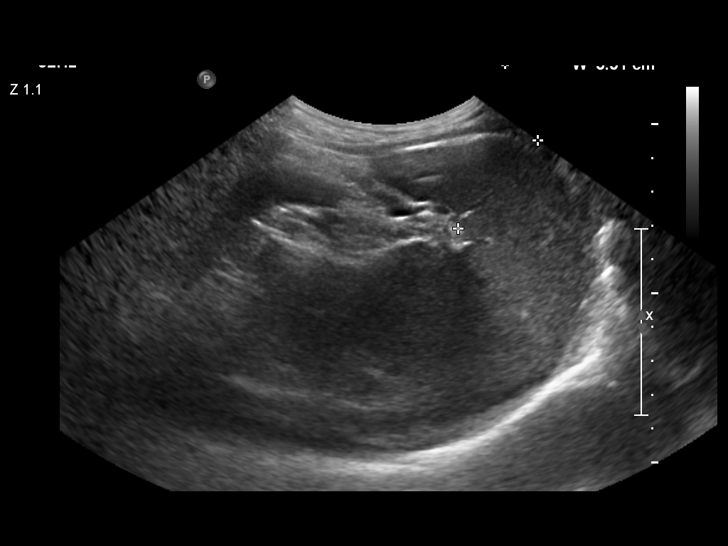

[14 of 25 positions shown; findings below may reference images not displayed]

FINDINGS: The increased unremarkable. Aorta IVC and portal vein are patent. Liver demonstrates normal size 15.7 cm with some increased echotexture. Gallbladder is unremarkable. Common bile duct is normal at 5 mm. Bilateral kidneys unremarkable at 11.5 cm on the right and 11.1 cm on the left. Spleen is normal at 9.8 cm.
IMPRESSION: Mild fatty infiltration of liver.

## 2022-10-31 IMAGING — MR MRI CARDIAC MORPHOLOGY AND FUNCTION WITHOUT IV CONTRAST
47 of 48 series · 47 of 48 positions shown · non-contrast
Comparison: none

PATIENT DID NOT WANT CONTRAST DUE TO POOR VEINS.
FINAL REPORT:
MRI CARDIAC MORPHOLOGY AND FUNCTION WITHOUT IV CONTRAST
Patient Name: DFFJK PENAH
ID: [id number]
ACCESSION: [id number]
EXAM DATE AND TIME:10/31/2022 [DATE]
ID:  51 yo man with abnormal septal wall motion and dilated aortic root
Exam:
1. Cardiac MRI  without contrast
2. Chest MRA w/o contrast
TECHNIQUE: Siemens 1.5T MRI scanner used. Black blood and bright blood imaging used in axial and coronal planes respectively for localization and anatomic definition.  Cine imaging of cardiac structures used for evaluation of function, structure, and flow.  Velocity-flow imaging of the aortic valve for flow and volume quantification.  Tissue parametric mapping.  Non-contrast chest MRA.  Image quality excellent.  Study analyzed on [employer] [id number] workstation. Patient refused use of contrast

[Series 2: (email) · sagittal · 8.0mm · 1.67mm/px · 1 of 11 slices shown]
[im 1/11]
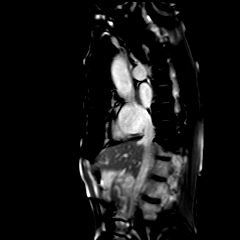

[Series 3: haste_16-sl_(person_name)_(person_name)_2bh · axial · 8.0mm · 1.33mm/px · 1 of 25 slices shown]
[im 1/25]
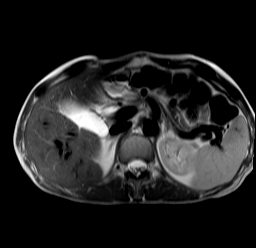

[Series 4: thorax_(person_name)_(person_name) · axial · 8.0mm · 1.67mm/px · 1 of 40 slices shown]
[im 1/40]
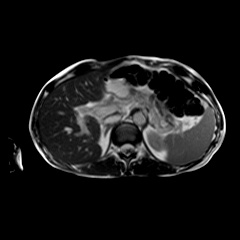

[Series 6: definelongaxis_[id number]_2ch · oblique · 8.0mm · 1.95mm/px · 1 of 3 slices shown]
[im 1/3]
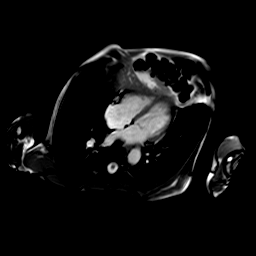

[Series 7: bSSFP · oblique · 6.0mm · 1.23mm/px · 1 of 25 slices shown (1 of 34)]
[im 1/25]
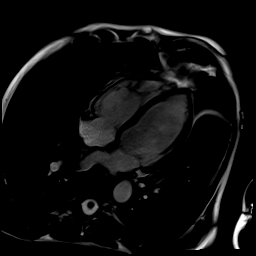

[Series 8: bSSFP · oblique · 6.0mm · 1.23mm/px · 1 of 25 slices shown (2 of 34)]
[im 1/25]
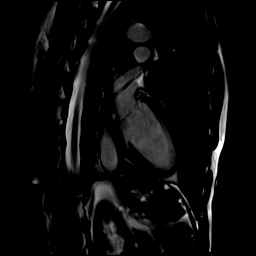

[Series 9: bSSFP · oblique · 6.0mm · 1.23mm/px · 1 of 25 slices shown (3 of 34)]
[im 1/25]
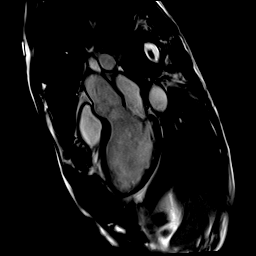

[Series 10: bSSFP · coronal · 6.0mm · 1.17mm/px · 1 of 25 slices shown (4 of 34)]
[im 1/25]
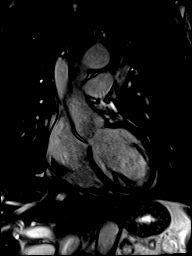

[Series 11: bSSFP · coronal · 6.0mm · 1.17mm/px · 1 of 25 slices shown (5 of 34)]
[im 1/25]
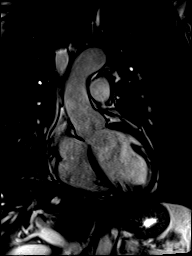

[Series 12: bSSFP · coronal · 6.0mm · 1.17mm/px · 1 of 25 slices shown (6 of 34)]
[im 1/25]
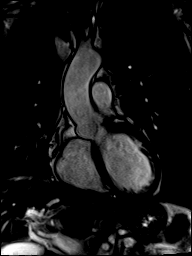

[Series 13: bSSFP · axial · 6.0mm · 1.56mm/px · 1 of 25 slices shown (7 of 34)]
[im 1/25]
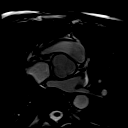

[Series 14: bSSFP · axial · 6.0mm · 1.56mm/px · 1 of 25 slices shown (8 of 34)]
[im 1/25]
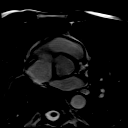

[Series 15: bSSFP · axial · 6.0mm · 1.56mm/px · 1 of 25 slices shown (9 of 34)]
[im 1/25]
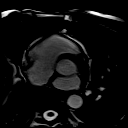

[Series 16: bSSFP · oblique · 6.0mm · 1.17mm/px · 1 of 25 slices shown (10 of 34)]
[im 1/25]
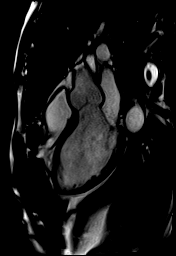

[Series 17: bSSFP · oblique · 6.0mm · 1.17mm/px · 1 of 25 slices shown (11 of 34)]
[im 1/25]
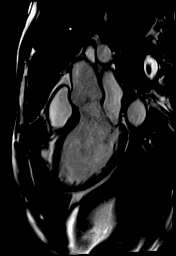

[Series 18: bSSFP · oblique · 6.0mm · 1.17mm/px · 1 of 25 slices shown (12 of 34)]
[im 1/25]
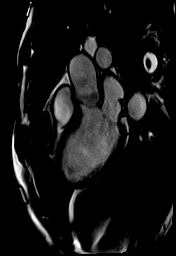

[Series 19: thru_flow_av · oblique · 6.0mm · 1.88mm/px · 1 of 25 slices shown (1 of 2)]
[im 1/25]
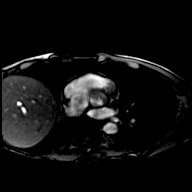

[Series 20: thru_flow_av_p · oblique · 6.0mm · 1.88mm/px · 1 of 25 slices shown (1 of 2)]
[im 1/25]
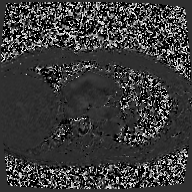

[Series 21: thru_flow_av · oblique · 6.0mm · 1.88mm/px · 1 of 25 slices shown (2 of 2)]
[im 1/25]
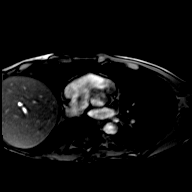

[Series 22: thru_flow_av_p · oblique · 6.0mm · 1.88mm/px · 1 of 25 slices shown (2 of 2)]
[im 1/25]
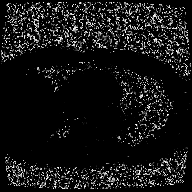

[Series 23: thru_flow_sub av · oblique · 6.0mm · 1.88mm/px · 1 of 25 slices shown]
[im 1/25]
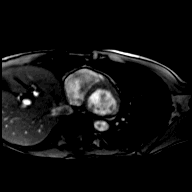

[Series 24: thru_flow_sub av_p · oblique · 6.0mm · 1.88mm/px · 1 of 25 slices shown]
[im 1/25]
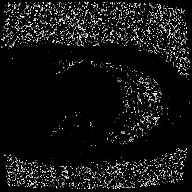

[Series 25: bSSFP · oblique · 7.0mm · 1.33mm/px · 1 of 25 slices shown (13 of 34)]
[im 1/25]
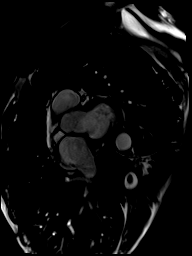

[Series 26: bSSFP · oblique · 7.0mm · 1.33mm/px · 1 of 25 slices shown (14 of 34)]
[im 1/25]
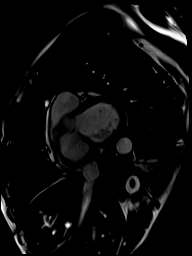

[Series 27: bSSFP · oblique · 7.0mm · 1.33mm/px · 1 of 25 slices shown (15 of 34)]
[im 1/25]
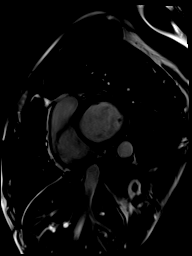

[Series 28: bSSFP · oblique · 7.0mm · 1.33mm/px · 1 of 25 slices shown (16 of 34)]
[im 1/25]
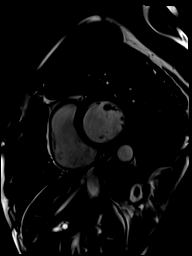

[Series 29: bSSFP · oblique · 7.0mm · 1.33mm/px · 1 of 25 slices shown (17 of 34)]
[im 1/25]
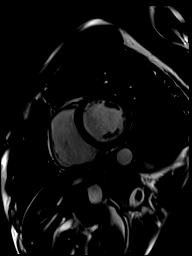

[Series 30: bSSFP · oblique · 7.0mm · 1.33mm/px · 1 of 25 slices shown (18 of 34)]
[im 1/25]
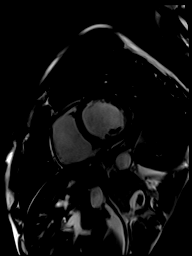

[Series 31: bSSFP · oblique · 7.0mm · 1.33mm/px · 1 of 25 slices shown (19 of 34)]
[im 1/25]
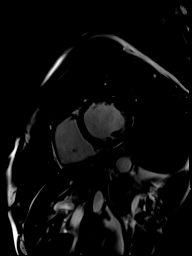

[Series 32: bSSFP · oblique · 7.0mm · 1.33mm/px · 1 of 25 slices shown (20 of 34)]
[im 1/25]
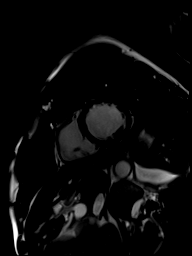

[Series 33: bSSFP · oblique · 7.0mm · 1.33mm/px · 1 of 25 slices shown (21 of 34)]
[im 1/25]
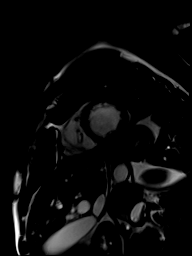

[Series 34: bSSFP · oblique · 7.0mm · 1.33mm/px · 1 of 25 slices shown (22 of 34)]
[im 1/25]
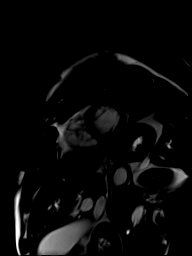

[Series 35: bSSFP · oblique · 7.0mm · 1.33mm/px · 1 of 25 slices shown (23 of 34)]
[im 1/25]
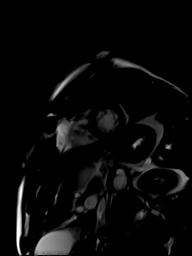

[Series 36: bSSFP · oblique · 7.0mm · 1.33mm/px · 1 of 25 slices shown (24 of 34)]
[im 1/25]
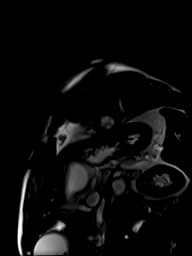

[Series 37: bSSFP · oblique · 7.0mm · 1.33mm/px · 1 of 25 slices shown (25 of 34)]
[im 1/25]
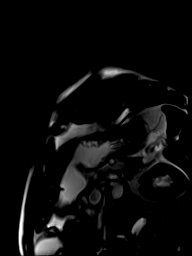

[Series 38: bSSFP · oblique · 7.0mm · 1.33mm/px · 1 of 25 slices shown (26 of 34)]
[im 1/25]
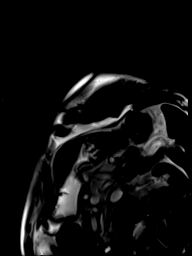

[Series 41: bSSFP · sagittal · 6.0mm · 1.33mm/px · 1 of 25 slices shown (27 of 34)]
[im 1/25]
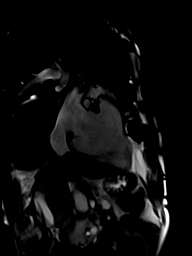

[Series 42: (id) pre_saxs · oblique · 7.0mm · 1.41mm/px · 1 of 24 slices shown]
[im 1/24]
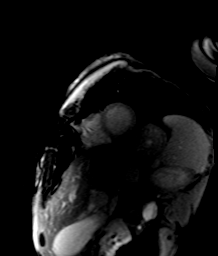

[Series 43: (id) pre_saxs_moco · oblique · 7.0mm · 1.41mm/px · 1 of 24 slices shown]
[im 1/24]
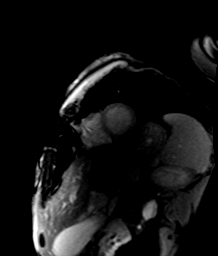

[Series 44: (id) pre_saxs_moco_t1 · oblique · 7.0mm · 1.41mm/px · 1 of 3 slices shown]
[im 1/3]
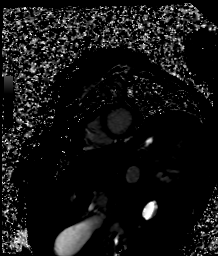

[Series 45: bSSFP · oblique · 6.0mm · 1.33mm/px · 1 of 25 slices shown (28 of 34)]
[im 1/25]
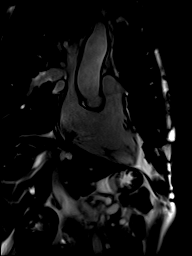

[Series 46: bSSFP · sagittal · 6.0mm · 1.50mm/px · 1 of 25 slices shown (29 of 34)]
[im 1/25]
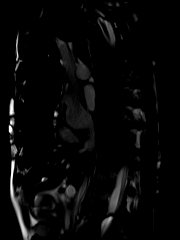

[Series 47: bSSFP · sagittal · 6.0mm · 1.50mm/px · 1 of 25 slices shown (30 of 34)]
[im 1/25]
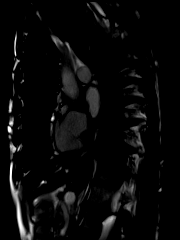

[Series 48: bSSFP · sagittal · 6.0mm · 1.50mm/px · 1 of 25 slices shown (31 of 34)]
[im 1/25]
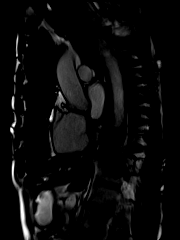

[Series 49: bSSFP · sagittal · 6.0mm · 1.50mm/px · 1 of 25 slices shown (32 of 34)]
[im 1/25]
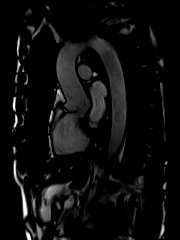

[Series 50: bSSFP · sagittal · 6.0mm · 1.50mm/px · 1 of 25 slices shown (33 of 34)]
[im 1/25]
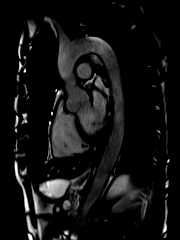

[Series 51: bSSFP · sagittal · 6.0mm · 1.50mm/px · 1 of 25 slices shown (34 of 34)]
[im 1/25]
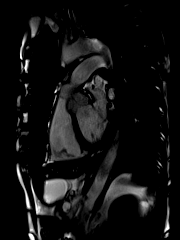

[47 of 48 positions shown; findings below may reference images not displayed]

FINDINGS: The pericardium is normal without effusion
The left ventricle is normal in size with normal function. There is no evidence of focal hypertrophy or wall thinning.
LVEDD 5.7 cm, LVESD 3.7 cm, basal septal wall 1.0 cm, basal posterior wall 0.7 cm
Right ventricle is normal in size and function
The left atria is normal size. Left atrial diameter on the 3-chamber view is 2.8 cm. The right atria is normal in size.
Aortic valve is trileaflet with no evidence of stenosis.  There is no significant AI.
The pulmonic valve appears morphologically normal. There is no evidence of stenosis or insufficiency
Mitral valve appears structurally normal.  There is no evidence of significant mitral regurgitation.
The tricuspid valve appears unremarkable without evidence of stenosis or insufficiency
Extracardiac vascular structures are normal along with venous structures.  Aortic dimensions below:
Annulus:
33.36 mm x 30.95 mm
ST Junction:
29.50 mm x 30.21 mm
Ascending aorta at level of main PA:
29.36 mm x 27.99 mm
Aortic Arch:
27.58 mm x 28.48 mm
Descending thoracic aorta:
25.2 mm x 24.6 mm
Measurements:
LV:
EDV:200.74 ml
ESV:76.79 ml
SV:123.95 ml
EF:61.75 %
Axel Andres Mass (Diast):106.43 g
EDV/BSA:108.00 ml/m2
ESV/BSA:41.31 ml/m2
Axel Andres Mass/BSA (Diast):57.26 g/m2
RV:
RVEDV:192.36 ml
RVESV:76.57 ml
RVSV:115.79 ml
RVEF:60.19 %
RVEDV/BSA:103.49 ml/m2
RVESV/BSA:41.20 ml/m2
Aortic Valve:
Total Forward Volume:118.71 ml
Maximum Velocity (1x1 Fanfan):117.12 cm/s
Atrial volume and function:
Min LA Vol: 26.49 ml (12.470 cm2)
Max LA Vol: 65.69 ml (21.579 cm2)
Min LA Vol/BSA: 14.25 ml/m2
Max LA Vol/BSA: 35.34 ml/m2
LA EF: 59.67 %
Min RA Vol: 33.90 ml (11.742 cm2)
Max RA Vol: 72.88 ml (21.405 cm2)
Min RA Vol/BSA: 18.24 ml/m2
Max RA Vol/BSA: 39.21 ml/m2
RA EF: 53.49 %
Tissue Mapping:
Native T1 4844 +- 96 ms
IMPRESSION: 
IMPRESSION: 1. Normal LV and RV size and function
2. Trileaflet aortic valve which has normal structure and function
3. Normal caliber thoracic aorta
4. No valvular abnormalities
5. Overall, structurally and functionally normal study

## 6206-12-02 DEATH — deceased
